# Patient Record
Sex: Male | Born: 1935 | Race: White | Hispanic: No | Marital: Married | Smoking: Former smoker
Health system: Southern US, Community
[De-identification: ages and names within clinical notes are randomized; demographics above are authoritative.]

## PROBLEM LIST (undated history)

## (undated) DIAGNOSIS — N400 Enlarged prostate without lower urinary tract symptoms: Secondary | ICD-10-CM

## (undated) DIAGNOSIS — F0390 Unspecified dementia without behavioral disturbance: Secondary | ICD-10-CM

## (undated) DIAGNOSIS — E785 Hyperlipidemia, unspecified: Secondary | ICD-10-CM

## (undated) DIAGNOSIS — I10 Essential (primary) hypertension: Secondary | ICD-10-CM

## (undated) DIAGNOSIS — M109 Gout, unspecified: Secondary | ICD-10-CM

---

## 2005-07-29 ENCOUNTER — Ambulatory Visit: Payer: Self-pay | Admitting: Internal Medicine

## 2006-06-30 ENCOUNTER — Other Ambulatory Visit: Payer: Self-pay

## 2006-07-01 ENCOUNTER — Inpatient Hospital Stay: Payer: Self-pay | Admitting: Internal Medicine

## 2006-07-08 ENCOUNTER — Ambulatory Visit: Payer: Self-pay | Admitting: Internal Medicine

## 2007-10-04 ENCOUNTER — Ambulatory Visit: Payer: Self-pay | Admitting: Family Medicine

## 2009-01-11 ENCOUNTER — Ambulatory Visit: Payer: Self-pay | Admitting: Internal Medicine

## 2010-03-15 ENCOUNTER — Ambulatory Visit: Payer: Self-pay | Admitting: Internal Medicine

## 2015-10-03 DIAGNOSIS — F0280 Dementia in other diseases classified elsewhere without behavioral disturbance: Secondary | ICD-10-CM | POA: Diagnosis not present

## 2015-10-03 DIAGNOSIS — M109 Gout, unspecified: Secondary | ICD-10-CM | POA: Diagnosis not present

## 2015-10-03 DIAGNOSIS — Z79899 Other long term (current) drug therapy: Secondary | ICD-10-CM | POA: Diagnosis not present

## 2015-10-03 DIAGNOSIS — Z87891 Personal history of nicotine dependence: Secondary | ICD-10-CM | POA: Diagnosis not present

## 2015-10-03 DIAGNOSIS — R195 Other fecal abnormalities: Secondary | ICD-10-CM | POA: Diagnosis not present

## 2015-10-03 DIAGNOSIS — K623 Rectal prolapse: Secondary | ICD-10-CM | POA: Diagnosis not present

## 2015-10-03 DIAGNOSIS — Z7982 Long term (current) use of aspirin: Secondary | ICD-10-CM | POA: Diagnosis not present

## 2015-10-03 DIAGNOSIS — I10 Essential (primary) hypertension: Secondary | ICD-10-CM | POA: Diagnosis not present

## 2015-10-03 DIAGNOSIS — K5641 Fecal impaction: Secondary | ICD-10-CM | POA: Diagnosis not present

## 2015-10-03 DIAGNOSIS — G309 Alzheimer's disease, unspecified: Secondary | ICD-10-CM | POA: Diagnosis not present

## 2015-10-03 DIAGNOSIS — R911 Solitary pulmonary nodule: Secondary | ICD-10-CM | POA: Diagnosis not present

## 2015-10-03 DIAGNOSIS — D72829 Elevated white blood cell count, unspecified: Secondary | ICD-10-CM | POA: Diagnosis not present

## 2015-10-03 DIAGNOSIS — K6289 Other specified diseases of anus and rectum: Secondary | ICD-10-CM | POA: Diagnosis not present

## 2015-10-03 DIAGNOSIS — I491 Atrial premature depolarization: Secondary | ICD-10-CM | POA: Diagnosis not present

## 2015-10-03 DIAGNOSIS — I447 Left bundle-branch block, unspecified: Secondary | ICD-10-CM | POA: Diagnosis not present

## 2015-10-03 DIAGNOSIS — I7090 Unspecified atherosclerosis: Secondary | ICD-10-CM | POA: Diagnosis not present

## 2015-10-04 DIAGNOSIS — K4020 Bilateral inguinal hernia, without obstruction or gangrene, not specified as recurrent: Secondary | ICD-10-CM | POA: Diagnosis not present

## 2015-10-04 DIAGNOSIS — K5641 Fecal impaction: Secondary | ICD-10-CM | POA: Diagnosis not present

## 2015-10-04 DIAGNOSIS — I10 Essential (primary) hypertension: Secondary | ICD-10-CM | POA: Diagnosis not present

## 2015-10-04 DIAGNOSIS — I493 Ventricular premature depolarization: Secondary | ICD-10-CM | POA: Diagnosis not present

## 2015-10-04 DIAGNOSIS — M1A9XX0 Chronic gout, unspecified, without tophus (tophi): Secondary | ICD-10-CM | POA: Diagnosis not present

## 2015-10-04 DIAGNOSIS — F0280 Dementia in other diseases classified elsewhere without behavioral disturbance: Secondary | ICD-10-CM | POA: Diagnosis not present

## 2015-10-04 DIAGNOSIS — N400 Enlarged prostate without lower urinary tract symptoms: Secondary | ICD-10-CM | POA: Diagnosis not present

## 2015-10-04 DIAGNOSIS — K5790 Diverticulosis of intestine, part unspecified, without perforation or abscess without bleeding: Secondary | ICD-10-CM | POA: Diagnosis not present

## 2015-10-04 DIAGNOSIS — G309 Alzheimer's disease, unspecified: Secondary | ICD-10-CM | POA: Diagnosis not present

## 2015-10-04 DIAGNOSIS — R001 Bradycardia, unspecified: Secondary | ICD-10-CM | POA: Diagnosis not present

## 2015-10-04 DIAGNOSIS — I2510 Atherosclerotic heart disease of native coronary artery without angina pectoris: Secondary | ICD-10-CM | POA: Diagnosis not present

## 2015-10-05 DIAGNOSIS — F0280 Dementia in other diseases classified elsewhere without behavioral disturbance: Secondary | ICD-10-CM | POA: Diagnosis not present

## 2015-10-05 DIAGNOSIS — I2510 Atherosclerotic heart disease of native coronary artery without angina pectoris: Secondary | ICD-10-CM | POA: Diagnosis not present

## 2015-10-05 DIAGNOSIS — G309 Alzheimer's disease, unspecified: Secondary | ICD-10-CM | POA: Diagnosis not present

## 2015-10-05 DIAGNOSIS — M1A9XX0 Chronic gout, unspecified, without tophus (tophi): Secondary | ICD-10-CM | POA: Diagnosis not present

## 2015-10-05 DIAGNOSIS — I10 Essential (primary) hypertension: Secondary | ICD-10-CM | POA: Diagnosis not present

## 2015-10-05 DIAGNOSIS — K5900 Constipation, unspecified: Secondary | ICD-10-CM | POA: Diagnosis not present

## 2015-10-05 DIAGNOSIS — K5641 Fecal impaction: Secondary | ICD-10-CM | POA: Diagnosis not present

## 2015-10-10 DIAGNOSIS — K5901 Slow transit constipation: Secondary | ICD-10-CM | POA: Diagnosis not present

## 2015-10-10 DIAGNOSIS — K5641 Fecal impaction: Secondary | ICD-10-CM | POA: Diagnosis not present

## 2015-10-30 DIAGNOSIS — G301 Alzheimer's disease with late onset: Secondary | ICD-10-CM | POA: Diagnosis not present

## 2015-10-30 DIAGNOSIS — F0280 Dementia in other diseases classified elsewhere without behavioral disturbance: Secondary | ICD-10-CM | POA: Diagnosis not present

## 2015-10-30 DIAGNOSIS — E782 Mixed hyperlipidemia: Secondary | ICD-10-CM | POA: Diagnosis not present

## 2015-10-30 DIAGNOSIS — I10 Essential (primary) hypertension: Secondary | ICD-10-CM | POA: Diagnosis not present

## 2015-10-30 DIAGNOSIS — Z23 Encounter for immunization: Secondary | ICD-10-CM | POA: Diagnosis not present

## 2015-10-30 DIAGNOSIS — M1A09X0 Idiopathic chronic gout, multiple sites, without tophus (tophi): Secondary | ICD-10-CM | POA: Diagnosis not present

## 2016-05-03 DIAGNOSIS — G301 Alzheimer's disease with late onset: Secondary | ICD-10-CM | POA: Diagnosis not present

## 2016-05-03 DIAGNOSIS — E782 Mixed hyperlipidemia: Secondary | ICD-10-CM | POA: Diagnosis not present

## 2016-05-03 DIAGNOSIS — M1A09X0 Idiopathic chronic gout, multiple sites, without tophus (tophi): Secondary | ICD-10-CM | POA: Diagnosis not present

## 2016-05-03 DIAGNOSIS — I10 Essential (primary) hypertension: Secondary | ICD-10-CM | POA: Diagnosis not present

## 2016-05-03 DIAGNOSIS — F0280 Dementia in other diseases classified elsewhere without behavioral disturbance: Secondary | ICD-10-CM | POA: Diagnosis not present

## 2016-05-03 DIAGNOSIS — F320 Major depressive disorder, single episode, mild: Secondary | ICD-10-CM | POA: Diagnosis not present

## 2016-05-31 DIAGNOSIS — F320 Major depressive disorder, single episode, mild: Secondary | ICD-10-CM | POA: Diagnosis not present

## 2016-09-01 DIAGNOSIS — R911 Solitary pulmonary nodule: Secondary | ICD-10-CM | POA: Diagnosis not present

## 2016-09-01 DIAGNOSIS — F320 Major depressive disorder, single episode, mild: Secondary | ICD-10-CM | POA: Diagnosis not present

## 2016-09-02 ENCOUNTER — Other Ambulatory Visit: Payer: Self-pay | Admitting: Family Medicine

## 2016-09-02 DIAGNOSIS — R911 Solitary pulmonary nodule: Secondary | ICD-10-CM

## 2016-09-09 ENCOUNTER — Ambulatory Visit: Payer: PPO

## 2016-11-01 DIAGNOSIS — G301 Alzheimer's disease with late onset: Secondary | ICD-10-CM | POA: Diagnosis not present

## 2016-11-01 DIAGNOSIS — F320 Major depressive disorder, single episode, mild: Secondary | ICD-10-CM | POA: Diagnosis not present

## 2016-11-01 DIAGNOSIS — M1A09X0 Idiopathic chronic gout, multiple sites, without tophus (tophi): Secondary | ICD-10-CM | POA: Diagnosis not present

## 2016-11-01 DIAGNOSIS — F0280 Dementia in other diseases classified elsewhere without behavioral disturbance: Secondary | ICD-10-CM | POA: Diagnosis not present

## 2016-11-01 DIAGNOSIS — I10 Essential (primary) hypertension: Secondary | ICD-10-CM | POA: Diagnosis not present

## 2016-11-01 DIAGNOSIS — E782 Mixed hyperlipidemia: Secondary | ICD-10-CM | POA: Diagnosis not present

## 2016-12-16 ENCOUNTER — Emergency Department: Payer: PPO

## 2016-12-16 ENCOUNTER — Inpatient Hospital Stay: Payer: PPO | Admitting: Certified Registered Nurse Anesthetist

## 2016-12-16 ENCOUNTER — Encounter
Admission: EM | Disposition: A | Discharge status: Skilled Nursing Facility | Payer: Self-pay | Source: Home / Self Care | Attending: Internal Medicine

## 2016-12-16 ENCOUNTER — Encounter: Payer: Self-pay | Admitting: Emergency Medicine

## 2016-12-16 ENCOUNTER — Inpatient Hospital Stay: Payer: PPO

## 2016-12-16 ENCOUNTER — Inpatient Hospital Stay
Admission: EM | Admit: 2016-12-16 | Discharge: 2016-12-20 | DRG: 470 | Disposition: A | Discharge status: Skilled Nursing Facility | Payer: PPO | Attending: Internal Medicine | Admitting: Internal Medicine

## 2016-12-16 DIAGNOSIS — S72001D Fracture of unspecified part of neck of right femur, subsequent encounter for closed fracture with routine healing: Secondary | ICD-10-CM | POA: Diagnosis not present

## 2016-12-16 DIAGNOSIS — R1312 Dysphagia, oropharyngeal phase: Secondary | ICD-10-CM | POA: Diagnosis not present

## 2016-12-16 DIAGNOSIS — Z8249 Family history of ischemic heart disease and other diseases of the circulatory system: Secondary | ICD-10-CM

## 2016-12-16 DIAGNOSIS — Z79899 Other long term (current) drug therapy: Secondary | ICD-10-CM

## 2016-12-16 DIAGNOSIS — S72091A Other fracture of head and neck of right femur, initial encounter for closed fracture: Secondary | ICD-10-CM | POA: Diagnosis not present

## 2016-12-16 DIAGNOSIS — I10 Essential (primary) hypertension: Secondary | ICD-10-CM | POA: Diagnosis not present

## 2016-12-16 DIAGNOSIS — F0281 Dementia in other diseases classified elsewhere with behavioral disturbance: Secondary | ICD-10-CM | POA: Diagnosis not present

## 2016-12-16 DIAGNOSIS — F0280 Dementia in other diseases classified elsewhere without behavioral disturbance: Secondary | ICD-10-CM | POA: Diagnosis not present

## 2016-12-16 DIAGNOSIS — F329 Major depressive disorder, single episode, unspecified: Secondary | ICD-10-CM | POA: Diagnosis not present

## 2016-12-16 DIAGNOSIS — M1000 Idiopathic gout, unspecified site: Secondary | ICD-10-CM | POA: Diagnosis not present

## 2016-12-16 DIAGNOSIS — J9811 Atelectasis: Secondary | ICD-10-CM | POA: Diagnosis not present

## 2016-12-16 DIAGNOSIS — R4182 Altered mental status, unspecified: Secondary | ICD-10-CM

## 2016-12-16 DIAGNOSIS — T402X5A Adverse effect of other opioids, initial encounter: Secondary | ICD-10-CM | POA: Diagnosis not present

## 2016-12-16 DIAGNOSIS — S72041A Displaced fracture of base of neck of right femur, initial encounter for closed fracture: Secondary | ICD-10-CM | POA: Diagnosis not present

## 2016-12-16 DIAGNOSIS — Z6824 Body mass index (BMI) 24.0-24.9, adult: Secondary | ICD-10-CM | POA: Diagnosis not present

## 2016-12-16 DIAGNOSIS — R400 Somnolence: Secondary | ICD-10-CM | POA: Diagnosis not present

## 2016-12-16 DIAGNOSIS — Z87891 Personal history of nicotine dependence: Secondary | ICD-10-CM | POA: Diagnosis not present

## 2016-12-16 DIAGNOSIS — R0902 Hypoxemia: Secondary | ICD-10-CM | POA: Diagnosis not present

## 2016-12-16 DIAGNOSIS — Z5189 Encounter for other specified aftercare: Secondary | ICD-10-CM | POA: Diagnosis not present

## 2016-12-16 DIAGNOSIS — J90 Pleural effusion, not elsewhere classified: Secondary | ICD-10-CM | POA: Diagnosis not present

## 2016-12-16 DIAGNOSIS — S72001A Fracture of unspecified part of neck of right femur, initial encounter for closed fracture: Secondary | ICD-10-CM | POA: Diagnosis not present

## 2016-12-16 DIAGNOSIS — S72009A Fracture of unspecified part of neck of unspecified femur, initial encounter for closed fracture: Secondary | ICD-10-CM | POA: Diagnosis not present

## 2016-12-16 DIAGNOSIS — W19XXXA Unspecified fall, initial encounter: Secondary | ICD-10-CM | POA: Diagnosis present

## 2016-12-16 DIAGNOSIS — G309 Alzheimer's disease, unspecified: Secondary | ICD-10-CM | POA: Diagnosis not present

## 2016-12-16 DIAGNOSIS — Z96641 Presence of right artificial hip joint: Secondary | ICD-10-CM | POA: Diagnosis not present

## 2016-12-16 DIAGNOSIS — Z471 Aftercare following joint replacement surgery: Secondary | ICD-10-CM | POA: Diagnosis not present

## 2016-12-16 DIAGNOSIS — M6281 Muscle weakness (generalized): Secondary | ICD-10-CM | POA: Diagnosis not present

## 2016-12-16 DIAGNOSIS — Z96649 Presence of unspecified artificial hip joint: Secondary | ICD-10-CM

## 2016-12-16 DIAGNOSIS — Z7982 Long term (current) use of aspirin: Secondary | ICD-10-CM

## 2016-12-16 DIAGNOSIS — M109 Gout, unspecified: Secondary | ICD-10-CM | POA: Diagnosis present

## 2016-12-16 HISTORY — PX: HIP ARTHROPLASTY: SHX981

## 2016-12-16 HISTORY — DX: Essential (primary) hypertension: I10

## 2016-12-16 HISTORY — DX: Gout, unspecified: M10.9

## 2016-12-16 LAB — BASIC METABOLIC PANEL
Anion gap: 7 (ref 5–15)
BUN: 22 mg/dL — AB (ref 6–20)
CHLORIDE: 108 mmol/L (ref 101–111)
CO2: 25 mmol/L (ref 22–32)
Calcium: 8.7 mg/dL — ABNORMAL LOW (ref 8.9–10.3)
Creatinine, Ser: 1.13 mg/dL (ref 0.61–1.24)
GFR calc Af Amer: >60 mL/min (ref >60)
GFR calc non Af Amer: 56 mL/min — ABNORMAL LOW (ref >60)
GLUCOSE: 114 mg/dL — AB (ref 65–99)
Potassium: 4.5 mmol/L (ref 3.5–5.1)
Sodium: 140 mmol/L (ref 135–145)

## 2016-12-16 LAB — CBC WITH DIFFERENTIAL/PLATELET
Basophils Absolute: 0 10*3/uL (ref 0–0.1)
Basophils Relative: 0 %
EOS PCT: 4 %
Eosinophils Absolute: 0.5 10*3/uL (ref 0–0.7)
HCT: 41.3 % (ref 40.0–52.0)
Hemoglobin: 13.9 g/dL (ref 13.0–18.0)
LYMPHS ABS: 1.3 10*3/uL (ref 1.0–3.6)
LYMPHS PCT: 11 %
MCH: 31.7 pg (ref 26.0–34.0)
MCHC: 33.6 g/dL (ref 32.0–36.0)
MCV: 94.5 fL (ref 80.0–100.0)
MONO ABS: 0.8 10*3/uL (ref 0.2–1.0)
Monocytes Relative: 7 %
Neutro Abs: 8.8 10*3/uL — ABNORMAL HIGH (ref 1.4–6.5)
Neutrophils Relative %: 78 %
Platelets: 180 10*3/uL (ref 150–440)
RBC: 4.37 MIL/uL — ABNORMAL LOW (ref 4.40–5.90)
RDW: 14 % (ref 11.5–14.5)
WBC: 11.4 10*3/uL — ABNORMAL HIGH (ref 3.8–10.6)

## 2016-12-16 LAB — PROTIME-INR
INR: 1.15
Prothrombin Time: 14.8 seconds (ref 11.4–15.2)

## 2016-12-16 SURGERY — HEMIARTHROPLASTY, HIP, DIRECT ANTERIOR APPROACH, FOR FRACTURE
Anesthesia: Spinal | Site: Hip | Laterality: Right | Wound class: Clean

## 2016-12-16 MED ORDER — TRANEXAMIC ACID 1000 MG/10ML IV SOLN
INTRAVENOUS | Status: DC | PRN
  Administered 2016-12-16: 1000 mg

## 2016-12-16 MED ORDER — ONDANSETRON HCL 4 MG PO TABS
4.0000 mg | ORAL_TABLET | Freq: Four times a day (QID) | ORAL | Status: DC | PRN
Start: 2016-12-16 — End: 2016-12-20

## 2016-12-16 MED ORDER — GLYCOPYRROLATE 0.2 MG/ML IJ SOLN
INTRAMUSCULAR | Status: DC | PRN
  Administered 2016-12-16: 0.2 mg via INTRAVENOUS

## 2016-12-16 MED ORDER — ALLOPURINOL 100 MG PO TABS
100.0000 mg | ORAL_TABLET | Freq: Every day | ORAL | Status: DC
  Administered 2016-12-17 – 2016-12-20 (×3): 100 mg via ORAL
  Filled 2016-12-16 (×3): qty 1

## 2016-12-16 MED ORDER — GLYCOPYRROLATE 0.2 MG/ML IJ SOLN
INTRAMUSCULAR | Status: AC
  Filled 2016-12-16: qty 1

## 2016-12-16 MED ORDER — FLEET ENEMA 7-19 GM/118ML RE ENEM
1.0000 | ENEMA | Freq: Once | RECTAL | Status: AC | PRN
  Administered 2016-12-20: 1 via RECTAL

## 2016-12-16 MED ORDER — CEFAZOLIN SODIUM-DEXTROSE 2-3 GM-% IV SOLR
INTRAVENOUS | Status: DC | PRN
  Administered 2016-12-16: 2 g via INTRAVENOUS

## 2016-12-16 MED ORDER — HYDROMORPHONE HCL 1 MG/ML IJ SOLN
0.5000 mg | INTRAMUSCULAR | Status: DC | PRN
  Administered 2016-12-16 – 2016-12-17 (×4): 0.5 mg via INTRAVENOUS
  Administered 2016-12-17: 1 mg via INTRAVENOUS
  Administered 2016-12-18 (×2): 0.5 mg via INTRAVENOUS
  Filled 2016-12-16 (×9): qty 1

## 2016-12-16 MED ORDER — FAMOTIDINE 20 MG PO TABS
20.0000 mg | ORAL_TABLET | Freq: Every day | ORAL | Status: DC
  Administered 2016-12-16 – 2016-12-20 (×3): 20 mg via ORAL
  Filled 2016-12-16 (×4): qty 1

## 2016-12-16 MED ORDER — ONDANSETRON HCL 4 MG/2ML IJ SOLN: 4.0000 mg | Freq: Four times a day (QID) | INTRAMUSCULAR | Status: DC | PRN

## 2016-12-16 MED ORDER — ATENOLOL 12.5 MG HALF TABLET
12.5000 mg | ORAL_TABLET | Freq: Every day | ORAL | Status: DC
  Administered 2016-12-17 – 2016-12-20 (×3): 12.5 mg via ORAL
  Filled 2016-12-16 (×4): qty 1

## 2016-12-16 MED ORDER — BISACODYL 10 MG RE SUPP
10.0000 mg | Freq: Every day | RECTAL | Status: DC | PRN
  Administered 2016-12-18 – 2016-12-20 (×2): 10 mg via RECTAL
  Filled 2016-12-16 (×2): qty 1

## 2016-12-16 MED ORDER — NEOMYCIN-POLYMYXIN B GU 40-200000 IR SOLN
Status: DC | PRN
  Administered 2016-12-16: 16 mL

## 2016-12-16 MED ORDER — DOCUSATE SODIUM 100 MG PO CAPS: 100.0000 mg | ORAL_CAPSULE | Freq: Two times a day (BID) | ORAL | Status: DC

## 2016-12-16 MED ORDER — ACETAMINOPHEN 500 MG PO TABS
1000.0000 mg | ORAL_TABLET | Freq: Four times a day (QID) | ORAL | Status: AC
  Administered 2016-12-16 – 2016-12-17 (×4): 1000 mg via ORAL
  Filled 2016-12-16 (×4): qty 2

## 2016-12-16 MED ORDER — PROPOFOL 500 MG/50ML IV EMUL
INTRAVENOUS | Status: DC | PRN
  Administered 2016-12-16: 25 ug/kg/min via INTRAVENOUS

## 2016-12-16 MED ORDER — BUPIVACAINE HCL (PF) 0.5 % IJ SOLN
INTRAMUSCULAR | Status: AC
  Filled 2016-12-16: qty 10

## 2016-12-16 MED ORDER — METOCLOPRAMIDE HCL 5 MG/ML IJ SOLN: 5.0000 mg | Freq: Three times a day (TID) | INTRAMUSCULAR | Status: DC | PRN

## 2016-12-16 MED ORDER — KETOROLAC TROMETHAMINE 15 MG/ML IJ SOLN: 15.0000 mg | Freq: Four times a day (QID) | INTRAMUSCULAR | Status: DC | PRN

## 2016-12-16 MED ORDER — SENNOSIDES-DOCUSATE SODIUM 8.6-50 MG PO TABS: 1.0000 | ORAL_TABLET | Freq: Every evening | ORAL | Status: DC | PRN

## 2016-12-16 MED ORDER — PANTOPRAZOLE SODIUM 40 MG PO TBEC
40.0000 mg | DELAYED_RELEASE_TABLET | Freq: Every day | ORAL | Status: DC
  Administered 2016-12-16 – 2016-12-20 (×4): 40 mg via ORAL
  Filled 2016-12-16 (×4): qty 1

## 2016-12-16 MED ORDER — DIPHENHYDRAMINE HCL 12.5 MG/5ML PO ELIX: 12.5000 mg | ORAL_SOLUTION | ORAL | Status: DC | PRN

## 2016-12-16 MED ORDER — BUPIVACAINE HCL (PF) 0.5 % IJ SOLN
INTRAMUSCULAR | Status: DC | PRN
  Administered 2016-12-16: 3 mL via INTRATHECAL

## 2016-12-16 MED ORDER — OXYCODONE HCL 5 MG/5ML PO SOLN: 5.0000 mg | Freq: Once | ORAL | Status: DC | PRN

## 2016-12-16 MED ORDER — EPHEDRINE SULFATE 50 MG/ML IJ SOLN
INTRAMUSCULAR | Status: DC | PRN
  Administered 2016-12-16: 10 mg via INTRAVENOUS

## 2016-12-16 MED ORDER — BUPIVACAINE LIPOSOME 1.3 % IJ SUSP
INTRAMUSCULAR | Status: DC | PRN
  Administered 2016-12-16: 20 mL

## 2016-12-16 MED ORDER — SODIUM CHLORIDE 0.9% FLUSH
3.0000 mL | Freq: Two times a day (BID) | INTRAVENOUS | Status: DC
  Administered 2016-12-17 – 2016-12-20 (×5): 3 mL via INTRAVENOUS

## 2016-12-16 MED ORDER — LACTATED RINGERS IV SOLN
INTRAVENOUS | Status: DC | PRN
  Administered 2016-12-16 (×2): via INTRAVENOUS

## 2016-12-16 MED ORDER — PROPOFOL 10 MG/ML IV BOLUS
INTRAVENOUS | Status: AC
  Filled 2016-12-16: qty 20

## 2016-12-16 MED ORDER — CEFAZOLIN SODIUM-DEXTROSE 2-4 GM/100ML-% IV SOLN
2.0000 g | Freq: Four times a day (QID) | INTRAVENOUS | Status: AC
  Administered 2016-12-16 – 2016-12-17 (×3): 2 g via INTRAVENOUS
  Filled 2016-12-16 (×4): qty 100

## 2016-12-16 MED ORDER — OXYCODONE HCL 5 MG PO TABS: 5.0000 mg | ORAL_TABLET | Freq: Once | ORAL | Status: DC | PRN

## 2016-12-16 MED ORDER — SEVOFLURANE IN SOLN
RESPIRATORY_TRACT | Status: AC
  Filled 2016-12-16: qty 250

## 2016-12-16 MED ORDER — FENTANYL CITRATE (PF) 100 MCG/2ML IJ SOLN: 25.0000 ug | INTRAMUSCULAR | Status: DC | PRN

## 2016-12-16 MED ORDER — METOPROLOL TARTRATE 5 MG/5ML IV SOLN: 5.0000 mg | INTRAVENOUS | Status: DC | PRN

## 2016-12-16 MED ORDER — PHENYLEPHRINE HCL 10 MG/ML IJ SOLN
INTRAMUSCULAR | Status: DC | PRN
  Administered 2016-12-16 (×3): 100 ug via INTRAVENOUS

## 2016-12-16 MED ORDER — ACETAMINOPHEN 325 MG PO TABS: 650.0000 mg | ORAL_TABLET | Freq: Four times a day (QID) | ORAL | Status: DC | PRN

## 2016-12-16 MED ORDER — ACETAMINOPHEN 650 MG RE SUPP: 650.0000 mg | Freq: Four times a day (QID) | RECTAL | Status: DC | PRN

## 2016-12-16 MED ORDER — CEFAZOLIN SODIUM-DEXTROSE 2-4 GM/100ML-% IV SOLN
2.0000 g | Freq: Once | INTRAVENOUS | Status: DC
  Filled 2016-12-16: qty 100

## 2016-12-16 MED ORDER — KCL IN DEXTROSE-NACL 20-5-0.9 MEQ/L-%-% IV SOLN
INTRAVENOUS | Status: DC
  Administered 2016-12-16: 22:00:00 via INTRAVENOUS
  Filled 2016-12-16 (×9): qty 1000

## 2016-12-16 MED ORDER — KETAMINE HCL 10 MG/ML IJ SOLN
INTRAMUSCULAR | Status: DC | PRN
  Administered 2016-12-16: 25 mg via INTRAVENOUS

## 2016-12-16 MED ORDER — ENOXAPARIN SODIUM 40 MG/0.4ML ~~LOC~~ SOLN
40.0000 mg | SUBCUTANEOUS | Status: DC
  Administered 2016-12-17 – 2016-12-20 (×3): 40 mg via SUBCUTANEOUS
  Filled 2016-12-16 (×4): qty 0.4

## 2016-12-16 MED ORDER — ACETAMINOPHEN 325 MG PO TABS
650.0000 mg | ORAL_TABLET | Freq: Four times a day (QID) | ORAL | Status: DC | PRN
  Administered 2016-12-20: 650 mg via ORAL
  Filled 2016-12-16: qty 2

## 2016-12-16 MED ORDER — PROPOFOL 10 MG/ML IV BOLUS
INTRAVENOUS | Status: DC | PRN
  Administered 2016-12-16: 20 mg via INTRAVENOUS

## 2016-12-16 MED ORDER — MIDAZOLAM HCL 2 MG/2ML IJ SOLN
INTRAMUSCULAR | Status: AC
  Filled 2016-12-16: qty 2

## 2016-12-16 MED ORDER — METOCLOPRAMIDE HCL 10 MG PO TABS: 5.0000 mg | ORAL_TABLET | Freq: Three times a day (TID) | ORAL | Status: DC | PRN

## 2016-12-16 MED ORDER — KETAMINE HCL 50 MG/ML IJ SOLN
INTRAMUSCULAR | Status: AC
  Filled 2016-12-16: qty 10

## 2016-12-16 MED ORDER — TRAMADOL HCL 50 MG PO TABS
50.0000 mg | ORAL_TABLET | Freq: Four times a day (QID) | ORAL | Status: DC | PRN
  Administered 2016-12-18: 50 mg via ORAL
  Filled 2016-12-16: qty 1

## 2016-12-16 MED ORDER — LIDOCAINE HCL (PF) 2 % IJ SOLN
INTRAMUSCULAR | Status: DC | PRN
  Administered 2016-12-16: 50 mg

## 2016-12-16 MED ORDER — LIDOCAINE HCL (PF) 2 % IJ SOLN
INTRAMUSCULAR | Status: AC
  Filled 2016-12-16: qty 2

## 2016-12-16 MED ORDER — MIDAZOLAM HCL 5 MG/5ML IJ SOLN
INTRAMUSCULAR | Status: DC | PRN
  Administered 2016-12-16 (×2): 1 mg via INTRAVENOUS

## 2016-12-16 MED ORDER — MAGNESIUM HYDROXIDE 400 MG/5ML PO SUSP: 30.0000 mL | Freq: Every day | ORAL | Status: DC | PRN

## 2016-12-16 MED ORDER — DOCUSATE SODIUM 100 MG PO CAPS
100.0000 mg | ORAL_CAPSULE | Freq: Two times a day (BID) | ORAL | Status: DC
  Administered 2016-12-16 – 2016-12-20 (×5): 100 mg via ORAL
  Filled 2016-12-16 (×7): qty 1

## 2016-12-16 MED ORDER — BUPIVACAINE-EPINEPHRINE (PF) 0.25% -1:200000 IJ SOLN
INTRAMUSCULAR | Status: DC | PRN
  Administered 2016-12-16: 30 mL

## 2016-12-16 MED ORDER — OXYCODONE HCL 5 MG PO TABS
5.0000 mg | ORAL_TABLET | ORAL | Status: DC | PRN
  Administered 2016-12-18: 5 mg via ORAL
  Filled 2016-12-16: qty 1
  Filled 2016-12-16: qty 2

## 2016-12-16 MED ORDER — ONDANSETRON HCL 4 MG PO TABS: 4.0000 mg | ORAL_TABLET | Freq: Four times a day (QID) | ORAL | Status: DC | PRN

## 2016-12-16 MED ORDER — SODIUM CHLORIDE 0.9 % IV SOLN
INTRAVENOUS | Status: DC | PRN
  Administered 2016-12-16: 20 ug/min via INTRAVENOUS

## 2016-12-16 MED ORDER — OMEGA-3-ACID ETHYL ESTERS 1 G PO CAPS
1.0000 | ORAL_CAPSULE | Freq: Every day | ORAL | Status: DC
  Administered 2016-12-17 – 2016-12-20 (×2): 1 g via ORAL
  Filled 2016-12-16 (×2): qty 1

## 2016-12-16 SURGICAL SUPPLY — 56 items
BAG DECANTER FOR FLEXI CONT (MISCELLANEOUS) IMPLANT
BLADE SAGITTAL WIDE XTHICK NO (BLADE) ×3 IMPLANT
BLADE SURG SZ20 CARB STEEL (BLADE) ×3 IMPLANT
BNDG COHESIVE 6X5 TAN STRL LF (GAUZE/BANDAGES/DRESSINGS) ×3 IMPLANT
BOWL CEMENT MIXING ADV NOZZLE (MISCELLANEOUS) IMPLANT
CANISTER SUCT 1200ML W/VALVE (MISCELLANEOUS) ×3 IMPLANT
CANISTER SUCT 3000ML PPV (MISCELLANEOUS) ×6 IMPLANT
CAPT HIP HEMI 2 ×3 IMPLANT
CHLORAPREP W/TINT 26ML (MISCELLANEOUS) ×6 IMPLANT
DECANTER SPIKE VIAL GLASS SM (MISCELLANEOUS) ×6 IMPLANT
DRAPE IMP U-DRAPE 54X76 (DRAPES) ×6 IMPLANT
DRAPE INCISE IOBAN 66X60 STRL (DRAPES) ×3 IMPLANT
DRAPE SHEET LG 3/4 BI-LAMINATE (DRAPES) ×3 IMPLANT
DRAPE SURG 17X11 SM STRL (DRAPES) ×3 IMPLANT
DRAPE SURG 17X23 STRL (DRAPES) ×3 IMPLANT
DRSG OPSITE POSTOP 4X12 (GAUZE/BANDAGES/DRESSINGS) ×3 IMPLANT
DRSG OPSITE POSTOP 4X14 (GAUZE/BANDAGES/DRESSINGS) ×3 IMPLANT
ELECT BLADE 6.5 EXT (BLADE) ×3 IMPLANT
ELECT CAUTERY BLADE 6.4 (BLADE) ×3 IMPLANT
ELECT REM PT RETURN 9FT ADLT (ELECTROSURGICAL) ×3
ELECTRODE REM PT RTRN 9FT ADLT (ELECTROSURGICAL) ×1 IMPLANT
GAUZE PACK 2X3YD (MISCELLANEOUS) IMPLANT
GLOVE BIO SURGEON STRL SZ8 (GLOVE) ×6 IMPLANT
GLOVE INDICATOR 8.0 STRL GRN (GLOVE) ×3 IMPLANT
GOWN STRL REUS W/ TWL LRG LVL3 (GOWN DISPOSABLE) ×1 IMPLANT
GOWN STRL REUS W/ TWL XL LVL3 (GOWN DISPOSABLE) ×1 IMPLANT
GOWN STRL REUS W/TWL LRG LVL3 (GOWN DISPOSABLE) ×2
GOWN STRL REUS W/TWL XL LVL3 (GOWN DISPOSABLE) ×2
HOOD PEEL AWAY FLYTE STAYCOOL (MISCELLANEOUS) ×6 IMPLANT
IV NS 100ML SINGLE PACK (IV SOLUTION) IMPLANT
LABEL OR SOLS (LABEL) ×3 IMPLANT
NDL SAFETY 18GX1.5 (NEEDLE) ×3 IMPLANT
NEEDLE FILTER BLUNT 18X 1/2SAF (NEEDLE) ×2
NEEDLE FILTER BLUNT 18X1 1/2 (NEEDLE) ×1 IMPLANT
NEEDLE SPNL 20GX3.5 QUINCKE YW (NEEDLE) ×3 IMPLANT
NS IRRIG 1000ML POUR BTL (IV SOLUTION) ×3 IMPLANT
PACK HIP PROSTHESIS (MISCELLANEOUS) ×3 IMPLANT
PILLOW ABDUC SM (MISCELLANEOUS) ×3 IMPLANT
PULSAVAC PLUS IRRIG FAN TIP (DISPOSABLE) ×3
SOL .9 NS 3000ML IRR  AL (IV SOLUTION) ×4
SOL .9 NS 3000ML IRR UROMATIC (IV SOLUTION) ×2 IMPLANT
STAPLER SKIN PROX 35W (STAPLE) ×3 IMPLANT
STRAP SAFETY BODY (MISCELLANEOUS) ×3 IMPLANT
SUT ETHIBOND 2 V 37 (SUTURE) ×9 IMPLANT
SUT VIC AB 1 CT1 36 (SUTURE) ×6 IMPLANT
SUT VIC AB 2-0 CT1 (SUTURE) ×9 IMPLANT
SUT VIC AB 2-0 CT1 27 (SUTURE) ×6
SUT VIC AB 2-0 CT1 TAPERPNT 27 (SUTURE) ×3 IMPLANT
SUT VICRYL 1-0 27IN ABS (SUTURE) ×6
SUTURE VICRYL 1-0 27IN ABS (SUTURE) ×2 IMPLANT
SYR 30ML LL (SYRINGE) ×9 IMPLANT
SYR TB 1ML 27GX1/2 LL (SYRINGE) IMPLANT
SYRINGE 10CC LL (SYRINGE) ×3 IMPLANT
TAPE TRANSPORE STRL 2 31045 (GAUZE/BANDAGES/DRESSINGS) ×3 IMPLANT
TIP BRUSH PULSAVAC PLUS 24.33 (MISCELLANEOUS) ×3 IMPLANT
TIP FAN IRRIG PULSAVAC PLUS (DISPOSABLE) ×1 IMPLANT

## 2016-12-16 NOTE — Consult Note (Signed)
ORTHOPAEDIC CONSULTATION  REQUESTING PHYSICIAN: Ramonita LabGouru, Aruna, MD  Chief Complaint:   Right hip pain.  History of Present Illness: Curtis Nunez is a 83 y.o. male with a history of gout, hypertension, and dementia who lives at home with his wife. The patient is supposed to ambulate with a cane or walker, but often chooses not to use them. Apparently he got up during the night and fell, landing on his right side. The fall was not witnessed. The patient was found this morning and brought to the emergency room where x-rays demonstrated a displaced right femoral neck fracture. The patient denies any associated injuries, and denies any loss of consciousness. The patient could not say whether he had any chest pain, shortness of breath, dizziness, chest pain, or other symptoms which may have precipitated his fall.  Past Medical History:  Diagnosis Date  . Gout   . Hypertension    History reviewed. No pertinent surgical history. Social History   Social History  . Marital status: Married    Spouse name: N/A  . Number of children: N/A  . Years of education: N/A   Social History Main Topics  . Smoking status: Former Smoker    Types: Cigarettes  . Smokeless tobacco: Former NeurosurgeonUser  . Alcohol use No  . Drug use: No  . Sexual activity: Not Asked   Other Topics Concern  . None   Social History Narrative  . None   History reviewed. No pertinent family history. No Known Allergies Prior to Admission medications   Medication Sig Start Date End Date Taking? Authorizing Provider  allopurinol (ZYLOPRIM) 100 MG tablet Take by mouth. 11/16/16  Yes [provider]  aspirin EC 81 MG tablet Take 81 mg by mouth daily.    Yes [provider]  atenolol (TENORMIN) 25 MG tablet Take 12.5 mg by mouth daily. 06/01/16  Yes [provider]  COCONUT OIL PO Take 1 tablet by mouth daily.   Yes [provider]   Omega-3 Fatty Acids (FISH OIL OMEGA-3 PO) Take 1 tablet by mouth daily.   Yes [provider]   Dg Chest 1 View  Result Date: 12/16/2016 CLINICAL DATA:  Proper evaluation, hip fracture, history hypertension, former smoker EXAM: CHEST 1 VIEW COMPARISON:  Portable exam at 1132 hours correlated with prior CT chest 07/01/2006 FINDINGS: Enlargement of cardiac silhouette. Atherosclerotic calcification aorta. Mediastinal contours and pulmonary vascularity normal. Bibasilar atelectasis. Lungs otherwise clear. No pleural effusion or pneumothorax. Bones demineralized. IMPRESSION: Enlargement cardiac silhouette. Bibasilar atelectasis. Aortic atherosclerosis. Electronically Signed   By: Ulyses SouthwardMark  Boles M.D.   On: 12/16/2016 11:47   Dg Hip Unilat W Or Wo Pelvis 2-3 Views Right  Result Date: 12/16/2016 CLINICAL DATA:  Fall, right hip pain EXAM: DG HIP (WITH OR WITHOUT PELVIS) 2-3V RIGHT COMPARISON:  None. FINDINGS: There is a right femoral neck fracture with varus angulation. No subluxation or dislocation. IMPRESSION: Right femoral neck fracture with varus angulation. Electronically Signed   By: Charlett NoseKevin  Dover M.D.   On: 12/16/2016 11:08    Positive ROS: All other systems have been reviewed and were otherwise negative with the exception of those mentioned in the HPI and as above.  Physical Exam: General:  Alert, no acute distress Psychiatric:  Patient is competent for consent with normal mood and affect   Cardiovascular:  No pedal edema Respiratory:  No wheezing, non-labored breathing GI:  Abdomen is soft and non-tender Skin:  No lesions in the area of chief complaint Neurologic:  Sensation intact distally Lymphatic:  No axillary or cervical lymphadenopathy  Orthopedic Exam:  Orthopedic examination is limited to the right hip and lower extremity. The hip is held in a flexed and internally rotated position. Skin inspection around the right hip is unremarkable. There is no swelling, erythema, ecchymosis,  or abrasions. He has mild tenderness to palpation over the lateral aspect of the right hip. He has more significant pain with any attempted active or passive motion of the hip. He is able to dorsiflex and plantarflex his toes and ankle on the right side. Sensation is intact to light touch to all distributions. Good capillary refill to his right foot.  X-rays:  X-rays of the pelvis and right hip are available for review. These films demonstrate a displaced right femoral neck fracture. No significant degenerative changes are noted. No lytic lesions or other acute bony abnormalities are identified.  Assessment: Displaced right femoral neck fracture.  Plan: The treatment options are discussed with the patient and his family, who are at the bedside. Both nonsurgical and surgical options were discussed. The patient and his family would like to proceed with surgical intervention to include a right hip hemiarthroplasty. This procedure has been discussed in detail with the patient, as have the potential risks (including bleeding, infection, nerve and/or blood vessel injury, persistent or recurrent pain, stiffness of the hip, dislocation, leg length inequality, need for further surgery, blood clots, strokes, heart attacks and/or arrhythmias, etc.) and benefits. The patient's family state their understanding and agreed to proceed. A formal written consent has been obtained.  Thank you for ask me to precipitate in the care of this most pleasant unfortunate man. I will be happy to follow him with you.   Maryagnes Amos, MD  Beeper #:  [phone]  12/16/2016 4:44 PM

## 2016-12-16 NOTE — H&P (Addendum)
[facility] - [place] at [facility]   PATIENT NAME: Curtis Nunez    MR#:  [id number]  DATE OF BIRTH:  1927-02-26  DATE OF ADMISSION:  12/16/2016  PRIMARY CARE PHYSICIAN: System, Pcp Not In   REQUESTING/REFERRING PHYSICIAN: STAFFORD  CHIEF COMPLAINT:  Fall and hip pain  HISTORY OF PRESENT ILLNESS:  Curtis Nunez  is a 83 y.o. male with a known history ofAlzheimer's dementia Hypertension, gout and depression is brought into the ED after he sustained a fall at home. Patient has chronic dementia and could not give any meaningful history. X-ray of the hip has revealed a right femoral neck fracture. Ortho consulted and hospitalist team is called to admit the patient. Patient's EKG has revealed a bundle branch block, which seems to be old when compared to a year 2010 EKG  PAST MEDICAL HISTORY:   Past Medical History:  Diagnosis Date  . Gout   . Hypertension   Alzheimer's dementia  PAST SURGICAL HISTOIRY:  History reviewed. No pertinent surgical history.  SOCIAL HISTORY:   Social History  Substance Use Topics  . Smoking status: Former Smoker    Types: Cigarettes  . Smokeless tobacco: Former Neurosurgeon  . Alcohol use No    FAMILY HISTORY:  Hypertension runs in his family according to the wife  DRUG ALLERGIES:  No Known Allergies  REVIEW OF SYSTEMS:  Review of system unobtainable as the patient is demented  MEDICATIONS AT HOME:   Prior to Admission medications   Medication Sig Start Date End Date Taking? Authorizing Provider  allopurinol (ZYLOPRIM) 100 MG tablet Take by mouth. 11/16/16  Yes [provider]  aspirin EC 81 MG tablet Take 81 mg by mouth daily.    Yes [provider]  atenolol (TENORMIN) 25 MG tablet Take 12.5 mg by mouth daily. 06/01/16  Yes [provider]  COCONUT OIL PO Take 1 tablet by mouth daily.   Yes [provider]  Omega-3 Fatty Acids (FISH OIL OMEGA-3 PO) Take 1 tablet by mouth daily.   Yes  [provider]      VITAL SIGNS:  Blood pressure (!) 169/77, pulse (!) 56, temperature 98.3 F (36.8 C), temperature source Oral, resp. rate 17, height 5\' 7"  (1.702 m), weight 70.3 kg (155 lb), SpO2 94 %.  PHYSICAL EXAMINATION:  GENERAL:  83 y.o.-year-old patient lying in the bed with no acute distress.  EYES: Pupils equal, round, reactive to light and accommodation. No scleral icterus.HEENT: Head atraumatic, normocephalic. Oropharynx and nasopharynx clear.  NECK:  Supple, no jugular venous distention. No thyroid enlargement, no tenderness.  LUNGS: Normal breath sounds bilaterally, no wheezing, rales,rhonchi or crepitation. No use of accessory muscles of respiration.  CARDIOVASCULAR: S1, S2 normal. No murmurs, rubs, or gallops.  ABDOMEN: Soft, nontender, nondistended. Bowel sounds present.  EXTREMITIES: Right hip is abducted and externally rotated No pedal edema, cyanosis, or clubbing.  NEUROLOGIC: Patient is chronically demented, and alert  PSYCHIATRIC: The patient is alert and disoriented from dementia SKIN: No obvious rash, lesion, or ulcer.   LABORATORY PANEL:   CBC No results for input(s): WBC, HGB, HCT, PLT in the last 168 hours. ------------------------------------------------------------------------------------------------------------------  Chemistries  No results for input(s): NA, K, CL, CO2, GLUCOSE, BUN, CREATININE, CALCIUM, MG, AST, ALT, ALKPHOS, BILITOT in the last 168 hours.  Invalid input(s): GFRCGP ------------------------------------------------------------------------------------------------------------------  Cardiac Enzymes No results for input(s): TROPONINI in the last 168 hours. ------------------------------------------------------------------------------------------------------------------  RADIOLOGY:  Dg Chest 1 View  Result Date: 12/16/2016 CLINICAL DATA:  Proper evaluation, hip fracture, history hypertension, former smoker EXAM: CHEST 1 VIEW  COMPARISON:  Portable exam at 1132 hours correlated with prior CT chest 07/01/2006 FINDINGS: Enlargement of cardiac silhouette. Atherosclerotic calcification aorta. Mediastinal contours and pulmonary vascularity normal. Bibasilar atelectasis. Lungs otherwise clear. No pleural effusion or pneumothorax. Bones demineralized. IMPRESSION: Enlargement cardiac silhouette. Bibasilar atelectasis. Aortic atherosclerosis. Electronically Signed   By: Ulyses SouthwardMark  Boles M.D.   On: 12/16/2016 11:47   Dg Hip Unilat W Or Wo Pelvis 2-3 Views Right  Result Date: 12/16/2016 CLINICAL DATA:  Fall, right hip pain EXAM: DG HIP (WITH OR WITHOUT PELVIS) 2-3V RIGHT COMPARISON:  None. FINDINGS: There is a right femoral neck fracture with varus angulation. No subluxation or dislocation. IMPRESSION: Right femoral neck fracture with varus angulation. Electronically Signed   By: Charlett NoseKevin  Dover M.D.   On: 12/16/2016 11:08    EKG:   Orders placed or performed in visit on 01/11/09  . EKG 12-Lead    IMPRESSION AND PLAN:  Curtis Nunez  is a 83 y.o. male with a known history ofAlzheimer's dementia Hypertension, gout and depression is brought into the ED after he sustained a fall at home. Patient has chronic dementia and could not give any meaningful history. X-ray of the hip has revealed a right femoral neck fracture  #Right femoral neck fracture Admit to MedSurg unit Nothing by mouth except for sips and meds Pain management as needed Supportive treatment Orthopedics consulted  patient medically optimized for noncardiac surgery   #Hypertension Blood pressure is elevated, could be from the pain Provided Lopressor IV as needed Continue home medication atenolol  #Chronic history of gout No exacerbation at this time continue allopurinol, his home medication  #Chronic history of Alzheimer's dementia Patient is not on any home medications. Monitor clinically  GI prophylaxis with Pepcid DVT prophylaxis with SCDs  All the records  are reviewed and case discussed with ED provider. Management plans discussed with the patient, family and they are in agreement.  CODE STATUS: fc , wife HCPOA  TOTAL TIME TAKING CARE OF THIS PATIENT:42 minutes.   Note: This dictation was prepared with Dragon dictation along with smaller phrase technology. Any transcriptional errors that result from this process are unintentional.  Ramonita LabGouru, Labella Zahradnik M.D on 12/16/2016 at 1:33 PM  Between 7am to 6pm - Pager - [phone]  After 6pm go to [email or web address] - password [email or web address]  Fabio [facility] [place]  Office  [phone]  CC: Primary care physician; System, Pcp Not In

## 2016-12-16 NOTE — Op Note (Signed)
12/16/2016  6:36 PM  Patient:   Curtis Nunez  Pre-Op Diagnosis:   Displaced femoral neck fracture, right hip.  Post-Op Diagnosis:   Same.  Procedure:   Right hip unipolar hemiarthroplasty.  Surgeon:   Maryagnes Amos, MD  Assistant:   Horris Latino, PA-C  Anesthesia:   Spinal  Findings:   As above.  Complications:   None  EBL:   200 cc  Fluids:   1100 cc crystalloid  UOP:   200 cc  TT:   None  Drains:   None  Closure:   Staples  Implants:   Biomet press-fit system with a #10 standard offset reduced profile Echo femoral stem, a 49 mm outer diameter shell, a 28 mm head, and a -3 mm neck  Brief Clinical Note:   The patient is an 83 year old male who sustained the above-noted injury last evening when he fell at his home. He is brought to the emergency room this morning where x-rays demonstrated the above-noted fracture. The patient has been cleared medically and presents at this time for definitive management of the injury.  Procedure:   The patient was brought into the operating room. After adequate spinal anesthesia was obtained, the patient was repositioned in the left lateral decubitus position and secured using a lateral hip positioner. The right hip and lower extremity were prepped with ChloroPrep solution before being draped sterilely. Preoperative antibiotics were administered. A timeout was performed to verify the appropriate surgical site before a standard posterior approach to the hip was made through an approximately 4-5 inch incision. The incision was carried down through the subcutaneous tissues to expose the gluteal fascia and proximal end of the iliotibial band. These structures were split the length of the incision and the Charnley self-retaining hip retractor placed. The bursal tissues were swept posteriorly to expose the short external rotators. The anterior border of the piriformis tendon was identified and this plane developed down through the capsule to enter  the joint. Abundant fracture hematoma was suctioned. A flap of tissue was elevated off the posterior aspect of the femoral neck and greater trochanter and retracted posteriorly. This flap included the piriformis tendon, the short external rotators, and the posterior capsule. The femoral head was removed in its entirety, then taken to the back table where it was measured and found to be optimally replicated by a 49 mm head. The appropriate trial head was inserted and found to demonstrate an excellent suction fit.   Attention was directed to the femoral side. The femoral neck was recut 10-12 mm above the lesser trochanter using an oscillating saw. The piriformis fossa was debrided of soft tissues before the intramedullary canal was accessed through this point using a triple step reamer. The canal was reamed sequentially beginning with a #7 tapered reamer and progressing to a #10 tapered reamer. This provided excellent circumferential chatter. A box osteotome was used to establish version before the canal was broached sequentially beginning with a #7 broach and progressing to a #10 broach. This was left in place and several trial reductions performed. The permanent #10 reduce profile standard offset Biomet Echo femoral stem was impacted into place. A repeat trial reduction was performed using both the -6 mm and -3 mm neck lengths. The -3 mm neck length demonstrated excellent stability both in extension and external rotation as well as with flexion to 90 and internal rotation beyond 70. It also was stable in the position of sleep. The 49 mm outer diameter shell with  the -3 mm neck adapter construct was put together on the back table before being impacted onto the stem of the femoral component. The Morse taper locking mechanism was verified using manual distraction before the head was relocated and placed through a range of motion with the findings as described above.  The wound was copiously irrigated with  bacitracin saline solution via the jet lavage system before the peri-incisional and pericapsular tissues were injected with 30 cc of 0.5% Sensorcaine with epinephrine and 20 cc of Exparel diluted out to 60 cc with normal saline to help with postoperative analgesia. The posterior flap was reapproximated to the posterior aspect of the greater trochanter using #2 Tycron interrupted sutures placed through drill holes. Several additional #2 Tycron interrupted sutures were used to reinforce this layer of closure. The iliotibial band was reapproximated using #1 Vicryl interrupted sutures before the gluteal fascia was closed using a running #1 Vicryl suture. At this point, 1 g of transexemic acid in 10 cc of normal saline was injected into the joint to help reduce postoperative bleeding. The subcutaneous tissues were closed in several layers using 2-0 Vicryl interrupted sutures before the skin was closed using staples. A sterile occlusive dressing was applied to the wound before the patient was placed into an abduction wedge pillow. The patient was then rolled back into the supine position on the hospital bed before being awakened and returned to the recovery room in satisfactory condition after tolerating the procedure well.

## 2016-12-16 NOTE — Progress Notes (Signed)
Family Meeting Note  Advance Directive:yes  Today a meeting took place with the Patients spouse  Patient is unable to participate due ZO:[id number]:Lacked capacity Alzheimer's dementia   The following clinical team members were present during this meeting:MD  The following were discussed:Patient's diagnosis: Management and plan of care discussed with the patient's spouse, she is agreeable she is the healthcare power of attorney Patient's progosis: Unable to determine and Goals for treatment: Full Code  Additional follow-up to be provided: Hospitalist and orthopedics  Time spent during discussion:5717min  Aveon Colquhoun, Deanna ArtisAruna, MD

## 2016-12-16 NOTE — ED Notes (Signed)
Pt transported to OR by OR tech

## 2016-12-16 NOTE — Anesthesia Procedure Notes (Signed)
Spinal  Patient location during procedure: OR Staffing Anesthesiologist: Katy Fitch K Resident/CRNA: Rolla Plate Performed: resident/CRNA  Preanesthetic Checklist Completed: patient identified, site marked, surgical consent, pre-op evaluation, timeout performed, IV checked, risks and benefits discussed and monitors and equipment checked Spinal Block Patient position: sitting Prep: ChloraPrep and site prepped and draped Patient monitoring: heart rate, continuous pulse ox, blood pressure and cardiac monitor Approach: midline Location: L4-5 Injection technique: single-shot Needle Needle type: Introducer and Pencan  Needle gauge: 24 G Needle length: 9 cm Additional Notes Negative paresthesia. Negative blood return. Positive free-flowing CSF. Expiration date of kit checked and confirmed. Patient tolerated procedure well, without complications.

## 2016-12-16 NOTE — ED Notes (Signed)
Pt's top plate dentures removed, placed in denture cup, and given to spouse. Pt only has top plate at this time.

## 2016-12-16 NOTE — ED Triage Notes (Signed)
Pt via ems from home after falling last night. Pt c/o pain in right hip upon palpation. Pt unable to localize pain verbally; hx of dementia. Pt and spouse deny head injury or loc; wife states that "he falls around a lot." She states that he had mechanical fall. NAD noted.

## 2016-12-16 NOTE — ED Notes (Signed)
Received call from OR stating pt will be brought up in 30-45 min and not to transfer to floor at this time. Pt's family updated.

## 2016-12-16 NOTE — ED Provider Notes (Addendum)
[facility] Emergency Department Provider Note  ____________________________________________  Time seen: Approximately 11:29 AM  I have reviewed the triage vital signs and the nursing notes.   HISTORY  Chief Complaint Fall and Hip Pain  Level 5 caveat:  Portions of the history and physical were unable to be obtained due to the patient's altered mental status from chronic Alzheimer's dementia   HPI Curtis Nunez is a 83 y.o. male brought to the ED via EMS due to a fall at home. Patient denies any complaints but EMS does report hip pain on moving the patient. Otherwise patient is at his normal baseline. Per EMS no vomiting or bleeding on scene. Patient has been calm with them.     Past Medical History:  Diagnosis Date  . Gout   . Hypertension      There are no active problems to display for this patient.    History reviewed. No pertinent surgical history.   Prior to Admission medications   Not on File   Prescription Sig. Disp. Refills Start Date End Date Status  allopurinol (ZYLOPRIM) 100 MG tablet  Take 100 mg by mouth.   09/15/2015  Active  aspirin (ECOTRIN) 81 MG tablet  Take 81 mg by mouth.     Active  atenolol (TENORMIN) 25 MG tablet  Take 25 mg by mouth.   12/13/2014  Active  coconut oil, bulk, Oil       Active  UNABLE TO FIND  Take 2 capsules by mouth. Fish oil     Active  polyethylene glycol (MIRALAX) 17 gram packet            Allergies Patient has no known allergies.   History reviewed. No pertinent family history.  Social History Social History  Substance Use Topics  . Smoking status: Former Smoker    Types: Cigarettes  . Smokeless tobacco: Former NeurosurgeonUser  . Alcohol use No    Review of Systems Unable to obtain due to altered mental status.  ____________________________________________   PHYSICAL EXAM:  VITAL SIGNS: ED Triage Vitals  Enc Vitals Group     BP 12/16/16 0949 (!) 188/90      Pulse Rate 12/16/16 0949 (!) 55     Resp 12/16/16 0949 17     Temp 12/16/16 0949 98.3 F (36.8 C)     Temp Source 12/16/16 0949 Oral     SpO2 12/16/16 0949 93 %     Weight 12/16/16 0950 155 lb (70.3 kg)     Height 12/16/16 0950 5\' 7"  (1.702 m)     Head Circumference --      [facility] Flow --      Pain Score --      Pain Loc --      Pain Edu? --      Excl. in GC? --     Vital signs reviewed, nursing assessments reviewed.   Constitutional:   Alert and orientedTo self. Well appearing and in no distress. Eyes:   No scleral icterus.  EOMI. No nystagmus. No conjunctival pallor. PERRL. ENT   Head:   Normocephalic and atraumatic. No contusions or abrasions.   Nose:   No congestion/rhinnorhea.    Mouth/Throat:   MMM, no pharyngeal erythema. No peritonsillar mass.    Neck:   No meningismus. Full ROM. No midline tenderness Hematological/Lymphatic/Immunilogical:   No cervical lymphadenopathy. Cardiovascular:   RRR. Symmetric bilateral radial and DP pulses.  No murmurs.  Respiratory:   Normal respiratory effort without tachypnea/retractions. Breath  sounds are clear and equal bilaterally. No wheezes/rales/rhonchi. Gastrointestinal:   Soft and nontender. Non distended. There is no CVA tenderness.  No rebound, rigidity, or guarding. Genitourinary:   deferred Musculoskeletal:   Pain on palpation of right hip at the area of the greater trochanter. Pain with flexion of the right hip. Left lower extremity unremarkable. Right knee and distal femur unremarkable.. Neurologic:   Normal speech and language. Patient not able to provide precise answers regarding recent events. Motor grossly intact. No gross focal neurologic deficits are appreciated.  Skin:    Skin is warm, dry and intact. No rash noted.  No petechiae, purpura, or bullae.  ____________________________________________    LABS (pertinent positives/negatives) (all labs ordered are listed, but only abnormal results are  displayed) Labs Reviewed  BASIC METABOLIC PANEL  CBC WITH DIFFERENTIAL/PLATELET  PROTIME-INR   ____________________________________________   EKG  Interpreted by me   ____________________________________________    RADIOLOGY  Dg Hip Unilat W Or Wo Pelvis 2-3 Views Right  Result Date: 12/16/2016 CLINICAL DATA:  Fall, right hip pain EXAM: DG HIP (WITH OR WITHOUT PELVIS) 2-3V RIGHT COMPARISON:  None. FINDINGS: There is a right femoral neck fracture with varus angulation. No subluxation or dislocation. IMPRESSION: Right femoral neck fracture with varus angulation. Electronically Signed   By: Charlett Nose M.D.   On: 12/16/2016 11:08    ____________________________________________   PROCEDURES Procedures  ____________________________________________   INITIAL IMPRESSION / ASSESSMENT AND PLAN / ED COURSE  Pertinent labs & imaging results that were available during my care of the patient were reviewed by me and considered in my medical decision making (see chart for details).  Patient presents with right hip pain after an unwitnessed fall. No evidence of trauma anywhere else other than the right hip. X-ray does reveal a right femoral neck fracture. No evidence of CNS injury. Low suspicion of intracranial hemorrhage or spinal injury. We'll discuss with orthopedics and plan for hospitalization.   ----------------------------------------- 1:08 PM on 12/16/2016 -----------------------------------------  Discussed with orthopedics, request patient remain nothing by mouth. We'll insert Foley. Case discussed with hospitalist for admission.     ____________________________________________   FINAL CLINICAL IMPRESSION(S) / ED DIAGNOSES  Final diagnoses:  Closed displaced fracture of right femoral neck (HCC)  Alzheimer's dementia without behavioral disturbance, unspecified timing of dementia onset      New Prescriptions   No medications on file     Portions of this note  were generated with dragon dictation software. Dictation errors may occur despite best attempts at proofreading.    Sharman Cheek, MD 12/16/16 1136    Sharman Cheek, MD 12/16/16 1308

## 2016-12-16 NOTE — Anesthesia Post-op Follow-up Note (Cosign Needed)
Anesthesia QCDR form completed.        

## 2016-12-16 NOTE — Anesthesia Preprocedure Evaluation (Addendum)
Anesthesia Evaluation  Patient identified by MRN, date of birth, ID band Patient awake and Patient confused    Reviewed: Allergy & Precautions, H&P , NPO status , Patient's Chart, lab work & pertinent test results  History of Anesthesia Complications (+) PROLONGED EMERGENCE and history of anesthetic complications  Airway Mallampati: III  TM Distance: >3 FB Neck ROM: limited    Dental  (+) Poor Dentition, Missing, Edentulous Upper, Edentulous Lower   Pulmonary neg shortness of breath, former smoker,    Pulmonary exam normal breath sounds clear to auscultation       Cardiovascular Exercise Tolerance: Good hypertension, (-) angina(-) Past MI Normal cardiovascular exam Rhythm:regular Rate:Normal     Neuro/Psych negative neurological ROS  negative psych ROS   GI/Hepatic negative GI ROS, Neg liver ROS,   Endo/Other  negative endocrine ROS  Renal/GU      Musculoskeletal   Abdominal   Peds  Hematology negative hematology ROS (+)   Anesthesia Other Findings Past Medical History: No date: Gout No date: Hypertension  History reviewed. No pertinent surgical history.  BMI    Body Mass Index:  24.28 kg/m      Reproductive/Obstetrics negative OB ROS                            Anesthesia Physical Anesthesia Plan  ASA: III  Anesthesia Plan: Spinal   Post-op Pain Management:    Induction:   PONV Risk Score and Plan: 1 and Ondansetron and Treatment may vary due to age  Airway Management Planned: Natural Airway and Nasal Cannula  Additional Equipment:   Intra-op Plan:   Post-operative Plan:   Informed Consent: I have reviewed the patients History and Physical, chart, labs and discussed the procedure including the risks, benefits and alternatives for the proposed anesthesia with the patient or authorized representative who has indicated his/her understanding and acceptance.   Dental  Advisory Given  Plan Discussed with: Anesthesiologist, CRNA and Surgeon  Anesthesia Plan Comments: (Wife consented  Patient and family informed that patient is higher risk for complications from anesthesia during this procedure due to their medical history and age including but not limited to post operative cognitive dysfunction.  They voiced understanding.  Wife reports no bleeding problems and no anticoagulant use.  Plan for spinal with backup GA  Patient consented for risks of anesthesia including but not limited to:  - adverse reactions to medications - risk of bleeding, infection, nerve damage and headache - risk of failed spinal - damage to teeth, lips or other oral mucosa - sore throat or hoarseness - Damage to heart, brain, lungs or loss of life  Patient voiced understanding.)        Anesthesia Quick Evaluation

## 2016-12-16 NOTE — Transfer of Care (Signed)
Immediate Anesthesia Transfer of Care Note  Patient: Curtis Nunez  Procedure(s) Performed: Procedure(s): ARTHROPLASTY BIPOLAR HIP (HEMIARTHROPLASTY) (Right)  Patient Location: PACU  Anesthesia Type:Spinal  Level of Consciousness: sedated  Airway & Oxygen Therapy: Patient Spontanous Breathing and Patient connected to face mask oxygen  Post-op Assessment: Report given to RN and Post -op Vital signs reviewed and stable  Post vital signs: Reviewed and stable  Last Vitals:  Vitals:   12/16/16 1600 12/16/16 1601  BP: (!) 154/87   Pulse: 62 60  Resp: 17 18  Temp:      Last Pain:  Vitals:   12/16/16 0949  TempSrc: Oral         Complications: No apparent anesthesia complications

## 2016-12-17 ENCOUNTER — Encounter: Payer: Self-pay | Admitting: Surgery

## 2016-12-17 LAB — CBC WITH DIFFERENTIAL/PLATELET
Basophils Absolute: 0 10*3/uL (ref 0–0.1)
Basophils Relative: 0 %
EOS ABS: 0.5 10*3/uL (ref 0–0.7)
EOS PCT: 4 %
HCT: 33 % — ABNORMAL LOW (ref 40.0–52.0)
HEMOGLOBIN: 11.3 g/dL — AB (ref 13.0–18.0)
LYMPHS ABS: 1.5 10*3/uL (ref 1.0–3.6)
LYMPHS PCT: 13 %
MCH: 32.3 pg (ref 26.0–34.0)
MCHC: 34.3 g/dL (ref 32.0–36.0)
MCV: 94.3 fL (ref 80.0–100.0)
MONOS PCT: 7 %
Monocytes Absolute: 0.8 10*3/uL (ref 0.2–1.0)
NEUTROS PCT: 76 %
Neutro Abs: 8.8 10*3/uL — ABNORMAL HIGH (ref 1.4–6.5)
Platelets: 155 10*3/uL (ref 150–440)
RBC: 3.5 MIL/uL — ABNORMAL LOW (ref 4.40–5.90)
RDW: 14.4 % (ref 11.5–14.5)
WBC: 11.6 10*3/uL — ABNORMAL HIGH (ref 3.8–10.6)

## 2016-12-17 LAB — COMPREHENSIVE METABOLIC PANEL
ALT: 12 U/L — AB (ref 17–63)
AST: 23 U/L (ref 15–41)
Albumin: 2.7 g/dL — ABNORMAL LOW (ref 3.5–5.0)
Alkaline Phosphatase: 66 U/L (ref 38–126)
Anion gap: 7 (ref 5–15)
BUN: 22 mg/dL — AB (ref 6–20)
CHLORIDE: 111 mmol/L (ref 101–111)
CO2: 24 mmol/L (ref 22–32)
CREATININE: 1.26 mg/dL — AB (ref 0.61–1.24)
Calcium: 8 mg/dL — ABNORMAL LOW (ref 8.9–10.3)
GFR calc Af Amer: 57 mL/min — ABNORMAL LOW (ref >60)
GFR calc non Af Amer: 49 mL/min — ABNORMAL LOW (ref >60)
Glucose, Bld: 122 mg/dL — ABNORMAL HIGH (ref 65–99)
Potassium: 4.3 mmol/L (ref 3.5–5.1)
SODIUM: 142 mmol/L (ref 135–145)
Total Bilirubin: 0.7 mg/dL (ref 0.3–1.2)
Total Protein: 5.8 g/dL — ABNORMAL LOW (ref 6.5–8.1)

## 2016-12-17 MED ORDER — ASPIRIN EC 81 MG PO TBEC
81.0000 mg | DELAYED_RELEASE_TABLET | Freq: Every day | ORAL | Status: DC
  Administered 2016-12-17 – 2016-12-20 (×3): 81 mg via ORAL
  Filled 2016-12-17 (×3): qty 1

## 2016-12-17 NOTE — Anesthesia Postprocedure Evaluation (Signed)
Anesthesia Post Note  Patient: Brain HiltsRalph T Mauger  Procedure(s) Performed: Procedure(s) (LRB): ARTHROPLASTY BIPOLAR HIP (HEMIARTHROPLASTY) (Right)  Patient location during evaluation: Nursing Unit Anesthesia Type: Spinal Level of consciousness: awake and patient cooperative Pain management: pain level controlled Vital Signs Assessment: post-procedure vital signs reviewed and stable Respiratory status: spontaneous breathing, nonlabored ventilation and respiratory function stable Cardiovascular status: blood pressure returned to baseline and stable Postop Assessment: no headache and no backache     Last Vitals:  Vitals:   12/17/16 0045 12/17/16 0346  BP: (!) 145/77 135/71  Pulse: (!) 54 (!) 56  Resp:  18  Temp:  36.8 C    Last Pain:  Vitals:   12/17/16 0346  TempSrc: Axillary                 Ginger CarneStephanie Shimeka Bacot

## 2016-12-17 NOTE — NC FL2 (Signed)
[place] MEDICAID FL2 LEVEL OF CARE SCREENING TOOL     IDENTIFICATION  Patient Name: Curtis Nunez Birthdate: 1926-10-31 Sex: male Admission Date (Current Location): 12/16/2016  [place] Number:  Chiropodist and Address:  [facility], [address], [place], [place] [address]      Provider Number: [id number]  Attending Physician Name and Address:  Auburn Bilberry, MD  Relative Name and Phone Number:       Current Level of Care: Hospital Recommended Level of Care: Skilled Nursing Facility Prior Approval Number:    Date Approved/Denied:   PASRR Number:  [id number])  Discharge Plan: SNF    Current Diagnoses: Patient Active Problem List   Diagnosis Date Noted  . Hip fracture (HCC) 12/16/2016    Orientation RESPIRATION BLADDER Height & Weight     Self, Place  Normal Continent Weight: 155 lb (70.3 kg) Height:  5\' 7"  (170.2 cm)  BEHAVIORAL SYMPTOMS/MOOD NEUROLOGICAL BOWEL NUTRITION STATUS   (none)  (none) Continent Diet (Diet: Clear Liquid to be advanced. )  AMBULATORY STATUS COMMUNICATION OF NEEDS Skin   Extensive Assist Verbally Surgical wounds (Incision: Right Hip. )                       Personal Care Assistance Level of Assistance  Bathing, Feeding, Dressing Bathing Assistance: Limited assistance Feeding assistance: Independent Dressing Assistance: Limited assistance     Functional Limitations Info  Sight, Hearing, Speech Sight Info: Adequate Hearing Info: Adequate Speech Info: Adequate    SPECIAL CARE FACTORS FREQUENCY  PT (By licensed PT), OT (By licensed OT)     PT Frequency:  (5) OT Frequency:  (5)            Contractures      Additional Factors Info  Code Status, Allergies Code Status Info:  (Full Code. ) Allergies Info:  (No Known Allergies. )           Current Medications (12/17/2016):  This is the current hospital active medication list Current Facility-Administered Medications   Medication Dose Route Frequency Provider Last Rate Last Dose  . acetaminophen (TYLENOL) tablet 650 mg  650 mg Oral Q6H PRN Gouru, Aruna, MD       Or  . acetaminophen (TYLENOL) suppository 650 mg  650 mg Rectal Q6H PRN Gouru, Aruna, MD      . acetaminophen (TYLENOL) tablet 1,000 mg  1,000 mg Oral Q6H Poggi, Excell Seltzer, MD   1,000 mg at 12/17/16 0534  . allopurinol (ZYLOPRIM) tablet 100 mg  100 mg Oral Daily Gouru, Aruna, MD   100 mg at 12/17/16 0825  . atenolol (TENORMIN) tablet 12.5 mg  12.5 mg Oral Daily Gouru, Aruna, MD      . bisacodyl (DULCOLAX) suppository 10 mg  10 mg Rectal Daily PRN Poggi, Excell Seltzer, MD      . ceFAZolin (ANCEF) IVPB 2g/100 mL premix  2 g Intravenous Once Poggi, Excell Seltzer, MD      . ceFAZolin (ANCEF) IVPB 2g/100 mL premix  2 g Intravenous Q6H Poggi, Excell Seltzer, MD   Stopped at 12/17/16 5621  . dextrose 5 % and 0.9 % NaCl with KCl 20 mEq/L infusion   Intravenous Continuous Ramonita Lab, MD 75 mL/hr at 12/16/16 2138    . diphenhydrAMINE (BENADRYL) 12.5 MG/5ML elixir 12.5-25 mg  12.5-25 mg Oral Q4H PRN Poggi, Excell Seltzer, MD      . docusate sodium (COLACE) capsule 100 mg  100 mg  Oral BID Ramonita LabGouru, Aruna, MD   100 mg at 12/16/16 2058  . enoxaparin (LOVENOX) injection 40 mg  40 mg Subcutaneous Q24H Poggi, Excell SeltzerJohn J, MD   40 mg at 12/17/16 0824  . famotidine (PEPCID) tablet 20 mg  20 mg Oral Daily Gouru, Aruna, MD   20 mg at 12/17/16 0825  . HYDROmorphone (DILAUDID) injection 0.5-1 mg  0.5-1 mg Intravenous Q2H PRN Poggi, Excell SeltzerJohn J, MD   0.5 mg at 12/17/16 0316  . ketorolac (TORADOL) 15 MG/ML injection 15 mg  15 mg Intravenous Q6H PRN Gouru, Aruna, MD      . magnesium hydroxide (MILK OF MAGNESIA) suspension 30 mL  30 mL Oral Daily PRN Poggi, Excell SeltzerJohn J, MD      . metoCLOPramide (REGLAN) tablet 5-10 mg  5-10 mg Oral Q8H PRN Poggi, Excell SeltzerJohn J, MD       Or  . metoCLOPramide (REGLAN) injection 5-10 mg  5-10 mg Intravenous Q8H PRN Poggi, Excell SeltzerJohn J, MD      . metoprolol tartrate (LOPRESSOR) injection 5 mg  5 mg  Intravenous Q4H PRN Gouru, Aruna, MD      . omega-3 acid ethyl esters (LOVAZA) capsule 1 g  1 capsule Oral Daily Gouru, Aruna, MD      . ondansetron (ZOFRAN) tablet 4 mg  4 mg Oral Q6H PRN Gouru, Aruna, MD       Or  . ondansetron (ZOFRAN) injection 4 mg  4 mg Intravenous Q6H PRN Gouru, Aruna, MD      . oxyCODONE (Oxy IR/ROXICODONE) immediate release tablet 5-10 mg  5-10 mg Oral Q3H PRN Poggi, Excell SeltzerJohn J, MD      . pantoprazole (PROTONIX) EC tablet 40 mg  40 mg Oral Daily Poggi, Excell SeltzerJohn J, MD   40 mg at 12/17/16 0825  . senna-docusate (Senokot-S) tablet 1 tablet  1 tablet Oral QHS PRN Gouru, Aruna, MD      . sodium chloride flush (NS) 0.9 % injection 3 mL  3 mL Intravenous Q12H Gouru, Aruna, MD      . sodium phosphate (FLEET) 7-19 GM/118ML enema 1 enema  1 enema Rectal Once PRN Poggi, Excell SeltzerJohn J, MD      . traMADol Janean Sark(ULTRAM) tablet 50 mg  50 mg Oral Q6H PRN Ramonita LabGouru, Aruna, MD         Discharge Medications: Please see discharge summary for a list of discharge medications.  Relevant Imaging Results:  Relevant Lab Results:   Additional Information  (SSN: [id number])  Prudie Guthridge, Darleen CrockerBailey M, LCSW

## 2016-12-17 NOTE — Clinical Social Work Placement (Signed)
   CLINICAL SOCIAL WORK PLACEMENT  NOTE  Date:  12/17/2016  Patient Details  Name: Trixie RudeRalph T Balaban MRN: [id number] Date of Birth: 08/24/1926  Clinical Social Work is seeking post-discharge placement for this patient at the Skilled  Nursing Facility level of care (*CSW will initial, date and re-position this form in  chart as items are completed):  Yes   Patient/family provided with [facility] [place] Clinical Social Work Department's list of facilities offering this level of care within the geographic area requested by the patient (or if unable, by the patient's family).  Yes   Patient/family informed of their freedom to choose among providers that offer the needed level of care, that participate in Medicare, Medicaid or managed care program needed by the patient, have an available bed and are willing to accept the patient.  Yes   Patient/family informed of [facility] interest [facility], as well as of the fact that they are under no obligation to receive care at these facilities.  PASRR submitted to EDS on 12/17/16     PASRR number received on 12/17/16     Existing PASRR number confirmed on       FL2 transmitted to all facilities in geographic area requested by pt/family on 12/17/16     FL2 transmitted to all facilities within larger geographic area on       Patient informed that his/her managed care company has contracts with or will negotiate with certain facilities, including the following:        Yes   Patient/family informed of bed offers received.  Patient chooses bed at  ([facility] )     Physician recommends and patient chooses bed at      Patient to be transferred to   on  .  Patient to be transferred to facility by       Patient family notified on   of transfer.  Name of family member notified:        PHYSICIAN       Additional Comment:    _______________________________________________ Dabria Wadas, Darleen CrockerBailey M, LCSW 12/17/2016, 3:56  PM

## 2016-12-17 NOTE — Clinical Social Work Note (Signed)
Clinical Social Work Assessment  Patient Details  Name: Curtis Nunez MRN: [id number] Date of Birth: 20-Dec-1926  Date of referral:  12/17/16               Reason for consult:  Facility Placement                Permission sought to share information with:  Chartered certified accountant granted to share information::  Yes, Verbal Permission Granted  Name::      [place]::   [place]   Relationship::     Contact Information:     Housing/Transportation Living arrangements for the past 2 months:  Single Family Home Source of Information:  Spouse Patient Interpreter Needed:  None Criminal Activity/Legal Involvement Pertinent to Current Situation/Hospitalization:  No - Comment as needed Significant Relationships:  Adult Children, Other Family Members, Spouse Lives with:  Spouse Do you feel safe going back to the place where you live?  Yes Need for family participation in patient care:  Yes (Comment)  Care giving concerns:  Patient lives in [place] with his wife Curtis Nunez.    Social Worker assessment / plan:  Holiday representative (CSW) received SNF consult. PT is recommending SNF. CSW met with patient and his wife Curtis Nunez and granddaughter were at bedside. Patient was pleasantly confused and did not participate in assessment. CSW introduced self and explained role of CSW department. Patient's wife reported that they live in [place]. CSW explained SNF process and that Health Team will have to approve SNF. Wife is agreeable to SNF search in [place]. FL2 complete and faxed out.   CSW presented bed offers to patient's wife Curtis Nunez and she chose [facility]. Health Team authorization has been received for SNF, auth # [id number]. Plan is for patient to D/C to [facility] tomorrow pending medical clearance. [facility] liaison is aware of above. CSW will continue to follow and assist as needed.   Employment status:  Retired Office manager PT Recommendations:  [place] / Referral to community resources:  [place]  Patient/Family's Response to care:  Patient's wife is agreeable for patient to go to [facility].   Patient/Family's Understanding of and Emotional Response to Diagnosis, Current Treatment, and Prognosis:  Patient's wife was very pleasant and thanked CSW for assistance.   Emotional Assessment Appearance:  Appears stated age Attitude/Demeanor/Rapport:    Affect (typically observed):  Pleasant Orientation:  Oriented to Self, Fluctuating Orientation (Suspected and/or reported Sundowners) Alcohol / Substance use:  Not Applicable Psych involvement (Current and /or in the community):  No (Comment)  Discharge Needs  Concerns to be addressed:  Discharge Planning Concerns Readmission within the last 30 days:  No Current discharge risk:  Dependent with Mobility Barriers to Discharge:  Continued Medical Work up   [employer], Veronia Beets, LCSW 12/17/2016, 3:57 PM

## 2016-12-17 NOTE — Progress Notes (Signed)
Pt. C/o hip pain.Pt. Spit all his meds out. Meds crushed in applesauce including PO pain med. Pt. Spit out all pills.out. Oxycodone wasted and dilaudid 1mg  given IV. Pt. Resting quietly now.

## 2016-12-17 NOTE — Progress Notes (Signed)
Physical Therapy Treatment Patient Details Name: Curtis Nunez MRN: [id number] DOB: 1926-08-08 Today's Date: 12/17/2016    History of Present Illness Pt is an 83 y.o. male with a known history of Alzheimer's dementia Hypertension, gout and depression is brought into the ED after he sustained a fall at home. Patient has chronic dementia and could not give any meaningful history.  X-ray of the hip has revealed a right femoral neck fracture. Ortho consulted and hospitalist team is called to admit the patient.  Pt now s/p R hip hemiarthroplasty.     PT Comments    Pt presents with deficits in strength, transfers, mobility, gait, balance, and activity tolerance.  Pt required Mod A with sup to sit down from Max A during AM session.  Pt continued to require Max A with sit to sup.  Pt required Mod A with sit to/from stand from elevated EOB and as during the AM session was not able to stand fully upright.  Pt also required assistance to prevent posterior LOB and was unable to lift either LE from the floor.  Max standing tolerance increased from 5 to 10 sec but pt continues to sit impulsively back to EOB and is a high risk for falls.  Pt will benefit from PT services in a SNF setting upon discharge to address above deficits for decreased caregiver assistance and decreased fall risk.     Follow Up Recommendations  SNF     Equipment Recommendations  None recommended by PT    Recommendations for Other Services       Precautions / Restrictions Precautions Precautions: Fall;Posterior Hip Precaution Booklet Issued: Yes (comment) Precaution Comments: Posterior hip precaution education provided to pt and family Restrictions Weight Bearing Restrictions: Yes RLE Weight Bearing: Weight bearing as tolerated Other Position/Activity Restrictions: Abd pillow re-donned at end of session    Mobility  Bed Mobility Overal bed mobility: Needs Assistance Bed Mobility: Supine to Sit;Sit to Supine     Supine  to sit: Mod assist Sit to supine: Max assist      Transfers Overall transfer level: Needs assistance Equipment used: Rolling walker (2 wheeled) Transfers: Sit to/from Stand Sit to Stand: Mod assist         General transfer comment: Sit to/from stand from elevated EOB to maintain hip precautions; max standing time 5-10 sec with pt unable to stand fully upright  Ambulation/Gait             General Gait Details: Pt unable to pick either foot up off the ground   Stairs Stairs:  (Unable/unsafe to attempt)          Wheelchair Mobility    Modified Rankin (Stroke Patients Only)       Balance Overall balance assessment: Needs assistance Sitting-balance support: Feet supported;Bilateral upper extremity supported Sitting balance-Leahy Scale: Fair Sitting balance - Comments: Improved static sitting balance at EOB from poor in the AM to fair this PM session   Standing balance support: Bilateral upper extremity supported Standing balance-Leahy Scale: Poor Standing balance comment: Pt required min-mod A to prevent posterior LOB in standing                            Cognition Arousal/Alertness: Awake/alert Behavior During Therapy: WFL for tasks assessed/performed Overall Cognitive Status: History of cognitive impairments - at baseline  Exercises Total Joint Exercises Ankle Circles/Pumps: AAROM;Both;10 reps;15 reps Heel Slides: AAROM;Both;5 reps;10 reps Hip ABduction/ADduction: AAROM;Both;10 reps;15 reps Straight Leg Raises: AAROM;Both;10 reps;15 reps Long Arc Quad: AAROM;Both;10 reps Knee Flexion: AAROM;Both;10 reps    General Comments        Pertinent Vitals/Pain Pain Assessment:  (Pt unable to rate secondary to cognition)    Home Living Family/patient expects to be discharged to:: Other (Comment) (History provided by family secondary to pt's cognitive deficits ) Living Arrangements:  Spouse/significant other Available Help at Discharge: Family;Available 24 hours/day Type of Home: House Home Access: Stairs to enter Entrance Stairs-Rails: Right Home Layout: One level Home Equipment: Environmental consultant - 2 wheels      Prior Function Level of Independence: Independent          PT Goals (current goals can now be found in the care plan section) Acute Rehab PT Goals PT Goal Formulation: Patient unable to participate in goal setting Time For Goal Achievement: 12/30/16 Potential to Achieve Goals: Fair Progress towards PT goals: Progressing toward goals    Frequency    BID      PT Plan Current plan remains appropriate    Co-evaluation              AM-PAC PT "6 Clicks" Daily Activity  Outcome Measure  Difficulty turning over in bed (including adjusting bedclothes, sheets and blankets)?: Total Difficulty moving from lying on back to sitting on the side of the bed? : Total Difficulty sitting down on and standing up from a chair with arms (e.g., wheelchair, bedside commode, etc,.)?: Total Help needed moving to and from a bed to chair (including a wheelchair)?: Total Help needed walking in hospital room?: Total Help needed climbing 3-5 steps with a railing? : Total 6 Click Score: 6    End of Session Equipment Utilized During Treatment: Gait belt Activity Tolerance: Patient limited by pain;Patient limited by fatigue Patient left: in bed;with bed alarm set;with SCD's reapplied;with family/visitor present;with call bell/phone within reach Nurse Communication: Mobility status PT Visit Diagnosis: Other abnormalities of gait and mobility (R26.89);Muscle weakness (generalized) (M62.81)     Time: 4098-1191 PT Time Calculation (min) (ACUTE ONLY): 34 min  Charges:  $Therapeutic Exercise: 8-22 mins $Therapeutic Activity: 8-22 mins                    G Codes:       DElly Modena PT, DPT 12/17/16, 3:16 PM

## 2016-12-17 NOTE — Progress Notes (Signed)
[facility] [place] [facility]                                                                                                                                                                                  Patient Demographics   Curtis Nunez, is a 83 y.o. male, DOB - 03-Dec-1926, [id number]  Admit date - 12/16/2016   Admitting Physician Ramonita LabAruna Gouru, MD  Outpatient Primary MD for the patient is Feldpausch, Madaline Guthrieale E, MD   LOS - 1  Subjective: Patient currently asymptomatic was confused last night    Review of Systems:   CONSTITUTIONAL: Unable to provide due to dementia  Vitals:   Vitals:   12/17/16 0045 12/17/16 0346 12/17/16 0731 12/17/16 1155  BP: (!) 145/77 135/71 (!) 162/64   Pulse: (!) 54 (!) 56 (!) 55 (!) 58  Resp:  18 18   Temp:  98.2 F (36.8 C) 98 F (36.7 C)   TempSrc:  Axillary Axillary   SpO2:  99% 92% 95%  Weight:      Height:        Wt Readings from Last 3 Encounters:  12/16/16 155 lb (70.3 kg)     Intake/Output Summary (Last 24 hours) at 12/17/16 1314 Last data filed at 12/17/16 0359  Gross per 24 hour  Intake          1676.25 ml  Output              775 ml  Net           901.25 ml    Physical Exam:   GENERAL: Pleasant-appearing in no apparent distress.  HEAD, EYES, EARS, NOSE AND THROAT: Atraumatic, normocephalic. Extraocular muscles are intact. Pupils equal and reactive to light. Sclerae anicteric. No conjunctival injection. No oro-pharyngeal erythema.  NECK: Supple. There is no jugular venous distention. No bruits, no lymphadenopathy, no thyromegaly.  HEART: Regular rate and rhythm,. No murmurs, no rubs, no clicks.  LUNGS: Clear to auscultation bilaterally. No rales or rhonchi. No wheezes.  ABDOMEN: Soft, flat, nontender, nondistended. Has good bowel sounds. No hepatosplenomegaly appreciated.  EXTREMITIES: No evidence of any cyanosis, clubbing, or peripheral edema.  +2 pedal and radial pulses bilaterally.  NEUROLOGIC: The  patient is alert, awake, and oriented x3 with no focal motor or sensory deficits appreciated bilaterally.  SKIN: Moist and warm with no rashes appreciated.  Psych: Not anxious, depressed LN: No inguinal LN enlargement    Antibiotics   Anti-infectives    Start     Dose/Rate Route Frequency Ordered Stop   12/16/16 2300  ceFAZolin (ANCEF) IVPB 2g/100 mL premix     2 g 200 mL/hr over 30  Minutes Intravenous Every 6 hours 12/16/16 1947 12/17/16 1155   12/16/16 1830  ceFAZolin (ANCEF) IVPB 2g/100 mL premix     2 g 200 mL/hr over 30 Minutes Intravenous  Once 12/16/16 1317        Medications   Scheduled Meds: . acetaminophen  1,000 mg Oral Q6H  . allopurinol  100 mg Oral Daily  . atenolol  12.5 mg Oral Daily  . docusate sodium  100 mg Oral BID  . enoxaparin (LOVENOX) injection  40 mg Subcutaneous Q24H  . famotidine  20 mg Oral Daily  . omega-3 acid ethyl esters  1 capsule Oral Daily  . pantoprazole  40 mg Oral Daily  . sodium chloride flush  3 mL Intravenous Q12H   Continuous Infusions: .  ceFAZolin (ANCEF) IV    . dextrose 5 % and 0.9 % NaCl with KCl 20 mEq/L 75 mL/hr at 12/16/16 2138   PRN Meds:.acetaminophen **OR** acetaminophen, bisacodyl, diphenhydrAMINE, HYDROmorphone (DILAUDID) injection, ketorolac, magnesium hydroxide, metoCLOPramide **OR** metoCLOPramide (REGLAN) injection, metoprolol tartrate, ondansetron **OR** ondansetron (ZOFRAN) IV, oxyCODONE, senna-docusate, sodium phosphate, traMADol   Data Review:   Micro Results No results found for this or any previous visit (from the past 240 hour(s)).  Radiology Reports Dg Chest 1 View  Result Date: 12/16/2016 CLINICAL DATA:  Proper evaluation, hip fracture, history hypertension, former smoker EXAM: CHEST 1 VIEW COMPARISON:  Portable exam at 1132 hours correlated with prior CT chest 07/01/2006 FINDINGS: Enlargement of cardiac silhouette. Atherosclerotic calcification aorta. Mediastinal contours and pulmonary vascularity  normal. Bibasilar atelectasis. Lungs otherwise clear. No pleural effusion or pneumothorax. Bones demineralized. IMPRESSION: Enlargement cardiac silhouette. Bibasilar atelectasis. Aortic atherosclerosis. Electronically Signed   By: Ulyses Southward M.D.   On: 12/16/2016 11:47   Dg Hip Unilat W Or W/o Pelvis 2-3 Views Right  Result Date: 12/16/2016 CLINICAL DATA:  Right hip hemiarthroplasty. EXAM: DG HIP (WITH OR WITHOUT PELVIS) 2-3V RIGHT COMPARISON:  12/16/2016 FINDINGS: AP view of the lower pelvis with cross-table lateral view of the right hip shows the patient be status post left hip hemiarthroplasty. No evidence for immediate hardware complications. Bones are diffusely demineralized. IMPRESSION: Status post right hip hemiarthroplasty without evidence for immediate hardware complications. Electronically Signed   By: Kennith Center M.D.   On: 12/16/2016 20:39   Dg Hip Unilat W Or Wo Pelvis 2-3 Views Right  Result Date: 12/16/2016 CLINICAL DATA:  Fall, right hip pain EXAM: DG HIP (WITH OR WITHOUT PELVIS) 2-3V RIGHT COMPARISON:  None. FINDINGS: There is a right femoral neck fracture with varus angulation. No subluxation or dislocation. IMPRESSION: Right femoral neck fracture with varus angulation. Electronically Signed   By: Charlett Nose M.D.   On: 12/16/2016 11:08     CBC  Recent Labs Lab 12/16/16 1322 12/17/16 0427  WBC 11.4* 11.6*  HGB 13.9 11.3*  HCT 41.3 33.0*  PLT 180 155  MCV 94.5 94.3  MCH 31.7 32.3  MCHC 33.6 34.3  RDW 14.0 14.4  LYMPHSABS 1.3 1.5  MONOABS 0.8 0.8  EOSABS 0.5 0.5  BASOSABS 0.0 0.0    Chemistries   Recent Labs Lab 12/16/16 1322 12/17/16 0427  NA 140 142  K 4.5 4.3  CL 108 111  CO2 25 24  GLUCOSE 114* 122*  BUN 22* 22*  CREATININE 1.13 1.26*  CALCIUM 8.7* 8.0*  AST  --  23  ALT  --  12*  ALKPHOS  --  66  BILITOT  --  0.7   ------------------------------------------------------------------------------------------------------------------ estimated  creatinine clearance  is 37.9 mL/min (A) (by C-G formula based on SCr of 1.26 mg/dL (H)). ------------------------------------------------------------------------------------------------------------------ No results for input(s): HGBA1C in the last 72 hours. ------------------------------------------------------------------------------------------------------------------ No results for input(s): CHOL, HDL, LDLCALC, TRIG, CHOLHDL, LDLDIRECT in the last 72 hours. ------------------------------------------------------------------------------------------------------------------ No results for input(s): TSH, T4TOTAL, T3FREE, THYROIDAB in the last 72 hours.  Invalid input(s): FREET3 ------------------------------------------------------------------------------------------------------------------ No results for input(s): VITAMINB12, FOLATE, FERRITIN, TIBC, IRON, RETICCTPCT in the last 72 hours.  Coagulation profile  Recent Labs Lab 12/16/16 1322  INR 1.15    No results for input(s): DDIMER in the last 72 hours.  Cardiac Enzymes No results for input(s): CKMB, TROPONINI, MYOGLOBIN in the last 168 hours.  Invalid input(s): CK ------------------------------------------------------------------------------------------------------------------ Invalid input(s): POCBNP    Assessment & Plan   Curtis Nunez  is a 83 y.o. male with a known history ofAlzheimer's dementia Hypertension, gout and depression is brought into the ED after he sustained a fall at home. Patient has chronic dementia and could not give any meaningful history. X-ray of the hip has revealed a right femoral neck fracture  #Right femoral neck fracture Status post repair Physical therapy as tolerated  #Hypertension Intermittently elevated Continue atenolol I will add IV hydralazine when necessary   #Chronic history of gout No exacerbation at this time continue allopurinol, his home medication  #Chronic history of  Alzheimer's dementia Patient is not on any home medications. Monitor clinically  #Insulin use Lovenox for DVT prophylaxis       Code Status Orders        Start     Ordered   12/16/16 1948  Full code  Continuous     12/16/16 1947    Code Status History    Date Active Date Inactive Code Status Order ID Comments User Context   12/16/2016  7:47 PM 12/16/2016  7:47 PM Full Code [id number]  Ramonita Lab, MD Inpatient    Advance Directive Documentation     Most Recent Value  Type of Advance Directive  Healthcare Power of Attorney  Pre-existing out of facility DNR order (yellow form or pink MOST form)  -  "MOST" Form in Place?  -           Consults  ortohpedics  DVT Prophylaxis  Lovenox   Lab Results  Component Value Date   PLT 155 12/17/2016     Time Spent in minutes    Greater than 50% of time spent in care coordination and counseling patient regarding the condition and plan of care.   Auburn Bilberry M.D on 12/17/2016 at 1:14 PM  Between 7am to 6pm - Pager - [phone]  After 6pm go to [email or web address] - password [email or web address] [place] [facility] [place]  [facility] [place] [facility]   Office  [phone]

## 2016-12-17 NOTE — Progress Notes (Signed)
  Subjective: 1 Day Post-Op Procedure(s) (LRB): ARTHROPLASTY BIPOLAR HIP (HEMIARTHROPLASTY) (Right) Patient reports pain as mild.   Patient is well but has severe dementia PT and care management to assist with discharge planning. Negative for chest pain and shortness of breath Fever: no Gastrointestinal:Negative for nausea and vomiting  Objective: Vital signs in last 24 hours: Temp:  [97.1 F (36.2 C)-98.3 F (36.8 C)] 98 F (36.7 C) (06/08 0731) Pulse Rate:  [54-71] 55 (06/08 0731) Resp:  [12-19] 18 (06/08 0731) BP: (108-188)/(62-97) 162/64 (06/08 0731) SpO2:  [91 %-99 %] 92 % (06/08 0731) Weight:  [70.3 kg (155 lb)] 70.3 kg (155 lb) (06/07 0950)  Intake/Output from previous day:  Intake/Output Summary (Last 24 hours) at 12/17/16 0759 Last data filed at 12/17/16 0359  Gross per 24 hour  Intake          1676.25 ml  Output              775 ml  Net           901.25 ml    Intake/Output this shift: No intake/output data recorded.  Labs:  Recent Labs  12/16/16 1322 12/17/16 0427  HGB 13.9 11.3*    Recent Labs  12/16/16 1322 12/17/16 0427  WBC 11.4* 11.6*  RBC 4.37* 3.50*  HCT 41.3 33.0*  PLT 180 155    Recent Labs  12/16/16 1322 12/17/16 0427  NA 140 142  K 4.5 4.3  CL 108 111  CO2 25 24  BUN 22* 22*  CREATININE 1.13 1.26*  GLUCOSE 114* 122*  CALCIUM 8.7* 8.0*    Recent Labs  12/16/16 1322  INR 1.15     EXAM General - Patient is Disorganized, Confused and Lacking Extremity - ABD soft Sensation intact distally Intact pulses distally Dorsiflexion/Plantar flexion intact Incision: moderate drainage Dressing/Incision - blood tinged drainage Motor Function - intact, moving foot and toes well on exam.  Past Medical History:  Diagnosis Date  . Gout   . Hypertension     Assessment/Plan: 1 Day Post-Op Procedure(s) (LRB): ARTHROPLASTY BIPOLAR HIP (HEMIARTHROPLASTY) (Right) Active Problems:   Hip fracture (HCC)  Estimated body mass index  is 24.28 kg/m as calculated from the following:   Height as of this encounter: 5\' 7"  (1.702 m).   Weight as of this encounter: 70.3 kg (155 lb). Advance diet Up with therapy D/C IV fluids when tolerating po intake.  Labs reviewed, Hg 11.3 this AM, K+ 4.3. Up with therapy today.  Will likely need SNF placement upon discharge. CBC and BMP ordered for tomorrow morning.  DVT Prophylaxis - Lovenox, Foot Pumps and TED hose Weight-Bearing as tolerated to right leg  J. Horris LatinoLance McGhee, PA-C [facility] 12/17/2016, 7:59 AM

## 2016-12-17 NOTE — Care Management Note (Signed)
Case Management Note  Patient Details  Name: SHABAZZ MCKEY MRN: [id number] Date of Birth: 1927-05-29  Subjective/Objective:                  Met with patient, his wife, and several other family members to discuss discharge planning. They all would like for patient to go to short-term rehab at discharge. CSW following. He has a rolling walker available at home. Home health list left with wife for review if needed after SNF.  Action/Plan:   No current RNCM needs.   Expected Discharge Date:  12/19/16               Expected Discharge Plan:     In-House Referral:     Discharge planning Services  CM Consult  Post Acute Care Choice:  Home Health Choice offered to:  Patient, Spouse  DME Arranged:    DME Agency:     HH Arranged:    [place] Agency:     Status of Service:  In process, will continue to follow  If discussed at Long Length of Stay Meetings, dates discussed:    Additional Comments:  Marshell Garfinkel, RN 12/17/2016, 12:40 PM

## 2016-12-17 NOTE — Evaluation (Signed)
Physical Therapy Evaluation Patient Details Name: Curtis RudeRalph T Nunez MRN: [id number] DOB: 1927/01/23 Today's Date: 12/17/2016   History of Present Illness  Pt is an 83 y.o. male with a known history of Alzheimer's dementia Hypertension, gout and depression is brought into the ED after he sustained a fall at home. Patient has chronic dementia and could not give any meaningful history.  X-ray of the hip has revealed a right femoral neck fracture. Ortho consulted and hospitalist team is called to admit the patient.  Pt now s/p R hip hemiarthroplasty.     Clinical Impression  Pt presents with significant deficits in strength, transfers, mobility, gait, balance, and activity tolerance.  Pt required max A for all bed mobility tasks and mod A with sit to/from stand transfers from elevated EOB.  Pt required frequent Min A to maintain seated balance at EOB and once in standing pt unable to stand fully upright and would impulsively return to sitting with max standing tolerance < 5 sec.  Pt and family educated on posterior hip precautions and examples given on applications with functional tasks/gait.  Handout provided and reviewed with questions answered.  Pt will benefit from PT services in a SNF setting to address above deficits for decreased caregiver assistance and eventual safe return to prior living setting.        Follow Up Recommendations SNF    Equipment Recommendations  None recommended by PT    Recommendations for Other Services       Precautions / Restrictions Precautions Precautions: Fall;Posterior Hip Precaution Booklet Issued: Yes (comment) Precaution Comments: Posterior hip precaution education provided to pt and family Restrictions Weight Bearing Restrictions: Yes RLE Weight Bearing: Weight bearing as tolerated Other Position/Activity Restrictions: Abd pillow re-donned at end of session      Mobility  Bed Mobility Overal bed mobility: Needs Assistance Bed Mobility: Supine to  Sit;Sit to Supine     Supine to sit: Max assist Sit to supine: Max assist      Transfers Overall transfer level: Needs assistance Equipment used: Rolling walker (2 wheeled) Transfers: Sit to/from Stand Sit to Stand: Mod assist         General transfer comment: Sit to/from stand from elevated EOB to maintain hip precautions; max standing time 3-5 sec with pt unable to stand fully upright  Ambulation/Gait             General Gait Details: Unable  Stairs Stairs:  (Unable/unsafe to attempt)          Wheelchair Mobility    Modified Rankin (Stroke Patients Only)       Balance Overall balance assessment: Needs assistance Sitting-balance support: Feet supported;Bilateral upper extremity supported Sitting balance-Leahy Scale: Poor Sitting balance - Comments: Frequent min A to maintain seated balance at EOB   Standing balance support: Bilateral upper extremity supported Standing balance-Leahy Scale: Poor                               Pertinent Vitals/Pain Pain Assessment:  (Pt unable to rate secondary to cognition)    Home Living Family/patient expects to be discharged to:: Other (Comment) (History provided by family secondary to pt's cognitive deficits ) Living Arrangements: Spouse/significant other Available Help at Discharge: Family;Available 24 hours/day Type of Home: House Home Access: Stairs to enter Entrance Stairs-Rails: Right Entrance Stairs-Number of Steps: 3 Home Layout: One level Home Equipment: Walker - 2 wheels      Prior Function Level  of Independence: Independent               Hand Dominance   Dominant Hand:  (Pt unable to answer, family member unsure)    Extremity/Trunk Assessment        Lower Extremity Assessment Lower Extremity Assessment: Generalized weakness       Communication   Communication: Other (comment) (Verbal communication limited by cognition )  Cognition Arousal/Alertness:  Awake/alert Behavior During Therapy: WFL for tasks assessed/performed Overall Cognitive Status: History of cognitive impairments - at baseline                                        General Comments      Exercises     Assessment/Plan    PT Assessment Patient needs continued PT services  PT Problem List Decreased strength;Decreased activity tolerance;Decreased balance;Decreased mobility       PT Treatment Interventions DME instruction;Gait training;Stair training;Functional mobility training;Neuromuscular re-education;Balance training;Therapeutic exercise;Therapeutic activities;Patient/family education    PT Goals (Current goals can be found in the Care Plan section)  Acute Rehab PT Goals PT Goal Formulation: Patient unable to participate in goal setting Time For Goal Achievement: 12/30/16 Potential to Achieve Goals: Fair    Frequency BID   Barriers to discharge        Co-evaluation               AM-PAC PT "6 Clicks" Daily Activity  Outcome Measure Difficulty turning over in bed (including adjusting bedclothes, sheets and blankets)?: Total Difficulty moving from lying on back to sitting on the side of the bed? : Total Difficulty sitting down on and standing up from a chair with arms (e.g., wheelchair, bedside commode, etc,.)?: Total Help needed moving to and from a bed to chair (including a wheelchair)?: Total Help needed walking in hospital room?: Total Help needed climbing 3-5 steps with a railing? : Total 6 Click Score: 6    End of Session Equipment Utilized During Treatment: Gait belt Activity Tolerance: Patient limited by fatigue;Patient limited by pain Patient left: in bed;with bed alarm set;with SCD's reapplied;with call bell/phone within reach;with family/visitor present;Other (comment) (Hip abd pillow donned) Nurse Communication: Mobility status PT Visit Diagnosis: Other abnormalities of gait and mobility (R26.89);Muscle weakness  (generalized) (M62.81)    Time: 1610-9604 PT Time Calculation (min) (ACUTE ONLY): 35 min   Charges:   PT Evaluation $PT Eval Low Complexity: 1 Procedure PT Treatments $Therapeutic Activity: 8-22 mins   PT G Codes:        DElly Modena PT, DPT 12/17/16, 12:09 PM

## 2016-12-18 LAB — BASIC METABOLIC PANEL
ANION GAP: 6 (ref 5–15)
BUN: 19 mg/dL (ref 6–20)
CHLORIDE: 111 mmol/L (ref 101–111)
CO2: 23 mmol/L (ref 22–32)
Calcium: 7.8 mg/dL — ABNORMAL LOW (ref 8.9–10.3)
Creatinine, Ser: 1.16 mg/dL (ref 0.61–1.24)
GFR calc non Af Amer: 54 mL/min — ABNORMAL LOW (ref >60)
Glucose, Bld: 100 mg/dL — ABNORMAL HIGH (ref 65–99)
POTASSIUM: 4.1 mmol/L (ref 3.5–5.1)
SODIUM: 140 mmol/L (ref 135–145)

## 2016-12-18 LAB — BLOOD GAS, ARTERIAL
Acid-base deficit: 2.3 mmol/L — ABNORMAL HIGH (ref 0.0–2.0)
BICARBONATE: 20.9 mmol/L (ref 20.0–28.0)
FIO2: 21
O2 Saturation: 97.7 %
PATIENT TEMPERATURE: 37
PO2 ART: 95 mmHg (ref 83.0–108.0)
pCO2 arterial: 30 mmHg — ABNORMAL LOW (ref 32.0–48.0)
pH, Arterial: 7.45 (ref 7.350–7.450)

## 2016-12-18 LAB — CBC
HEMATOCRIT: 30.8 % — AB (ref 40.0–52.0)
Hemoglobin: 10.7 g/dL — ABNORMAL LOW (ref 13.0–18.0)
MCH: 33 pg (ref 26.0–34.0)
MCHC: 34.6 g/dL (ref 32.0–36.0)
MCV: 95.3 fL (ref 80.0–100.0)
Platelets: 145 10*3/uL — ABNORMAL LOW (ref 150–440)
RBC: 3.23 MIL/uL — AB (ref 4.40–5.90)
RDW: 14.2 % (ref 11.5–14.5)
WBC: 9.1 10*3/uL (ref 3.8–10.6)

## 2016-12-18 NOTE — Progress Notes (Signed)
PT Cancellation Note  Patient Details Name: Curtis Nunez MRN: [id number] DOB: 07-09-1927   Cancelled Treatment:    Reason Eval/Treat Not Completed: Fatigue/lethargy limiting ability to participate   Attempted PM session but put asleep and did not awaken.  Session held due to lethargy and ability to affectively participate.   Danielle DessSarah Verla Bryngelson 12/18/2016, 1:59 PM

## 2016-12-18 NOTE — Significant Event (Signed)
Rapid Response Event Note  Overview: called for rapid response, pt decreased 02 sats.      Initial Focused Assessment: pt laying in bed asleep, arousable (alzheimers hx), VSS 128/63, pulse 60, resp 16, 02 sats 99% on 3LNC. appears to comfortable, no distress.   Interventions: monitor vital signs  Plan of Care (if not transferred): monitor vital signs, notify if change  Event Summary:   at      at          Curtis Nunez,Curtis Nunez

## 2016-12-18 NOTE — Progress Notes (Signed)
Physical Therapy Treatment Patient Details Name: Curtis Nunez MRN: [id number] DOB: [birth year] Today's Date: 12/18/2016    History of Present Illness Pt is an 83 y.o. male with a known history of Alzheimer's dementia Hypertension, gout and depression is brought into the ED after he sustained a fall at home. Patient has chronic dementia and could not give any meaningful history.  X-ray of the hip has revealed a right femoral neck fracture. Ortho consulted and hospitalist team is called to admit the patient.  Pt now s/p R hip hemiarthroplasty.     PT Comments    Daughter requested to return later when approached earlier for session stating he had a bad night with increased pain.  Returned at 11:00 and she was agreeable for session.  Pt in bed sleeping and was generally agitated with attempts to replace O2 canula and exercises.  During LE ex attempts, pt noted to be inc of urine/BM.  Assisted nursing staff with care.  Pt was dependant for rolling left and right with no active assistance or ability to hold position today.  Pt not appropriate to attempt sitting or out of bed activities today.  Will continue as appropriate.   Follow Up Recommendations  SNF     Equipment Recommendations  None recommended by PT    Recommendations for Other Services       Precautions / Restrictions Precautions Precautions: Fall;Posterior Hip Restrictions Weight Bearing Restrictions: Yes RLE Weight Bearing: Weight bearing as tolerated Other Position/Activity Restrictions: Abd pillow re-donned at end of session    Mobility  Bed Mobility Overal bed mobility: Needs Assistance Bed Mobility: Rolling Rolling: Max assist;Total assist;+2 for physical assistance         General bed mobility comments: rolling for incontinence care  Transfers                 General transfer comment: deferred  Ambulation/Gait                 Stairs            Wheelchair Mobility    Modified Rankin  (Stroke Patients Only)       Balance                                            Cognition Arousal/Alertness: Awake/alert Behavior During Therapy: WFL for tasks assessed/performed Overall Cognitive Status: History of cognitive impairments - at baseline                                        Exercises Other Exercises Other Exercises: Pt resisting ROM exercises this am.  Pt with bilateral LE's flexed even with ABD pillow in place.  Pillow removed in attempts of exercises but pt resisting and with poor tolerance.   Other Exercises: Assisted rolling left/rigth with nursing staff for care.    General Comments        Pertinent Vitals/Pain Pain Assessment: Faces Faces Pain Scale: Hurts whole lot Pain Descriptors / Indicators: Operative site guarding;Moaning Pain Intervention(s): Limited activity within patient's tolerance;Patient requesting pain meds-RN notified    Home Living                      Prior Function  PT Goals (current goals can now be found in the care plan section) Progress towards PT goals: Not progressing toward goals - comment    Frequency    BID      PT Plan Current plan remains appropriate    Co-evaluation              AM-PAC PT "6 Clicks" Daily Activity  Outcome Measure  Difficulty turning over in bed (including adjusting bedclothes, sheets and blankets)?: Total Difficulty moving from lying on back to sitting on the side of the bed? : Total Difficulty sitting down on and standing up from a chair with arms (e.g., wheelchair, bedside commode, etc,.)?: Total Help needed moving to and from a bed to chair (including a wheelchair)?: Total Help needed walking in hospital room?: Total Help needed climbing 3-5 steps with a railing? : Total 6 Click Score: 6    End of Session Equipment Utilized During Treatment: Oxygen Activity Tolerance: Patient limited by pain;Treatment limited secondary to  agitation Patient left: in bed;with bed alarm set;with call bell/phone within reach;with family/visitor present Nurse Communication: Other (comment)       Time: 1610-9604: 1100-1124 PT Time Calculation (min) (ACUTE ONLY): 24 min  Charges:  $Therapeutic Activity: 8-22 mins                    G Codes:       Danielle DessSarah Mamta Rimmer, PTA 12/18/16, 11:33 AM

## 2016-12-18 NOTE — Progress Notes (Signed)
  Subjective: 2 Days Post-Op Procedure(s) (LRB): ARTHROPLASTY BIPOLAR HIP (HEMIARTHROPLASTY) (Right) Unable to obtain a pain score. Patient is well but has severe dementia Refused po meds last night. Plan is for SNF following discharge. Negative for chest pain and shortness of breath Fever: no Gastrointestinal:Negative for nausea and vomiting  Objective: Vital signs in last 24 hours: Temp:  [97.7 F (36.5 C)-98 F (36.7 C)] 98 F (36.7 C) (06/09 0751) Pulse Rate:  [54-104] 104 (06/09 0751) Resp:  [19-20] 19 (06/09 0751) BP: (122-171)/(57-63) 122/57 (06/08 2333) SpO2:  [81 %-96 %] 81 % (06/09 0751)  Intake/Output from previous day:  Intake/Output Summary (Last 24 hours) at 12/18/16 0809 Last data filed at 12/17/16 1827  Gross per 24 hour  Intake              600 ml  Output                0 ml  Net              600 ml    Intake/Output this shift: No intake/output data recorded.  Labs:  Recent Labs  12/16/16 1322 12/17/16 0427 12/18/16 0304  HGB 13.9 11.3* 10.7*    Recent Labs  12/17/16 0427 12/18/16 0304  WBC 11.6* 9.1  RBC 3.50* 3.23*  HCT 33.0* 30.8*  PLT 155 145*    Recent Labs  12/17/16 0427 12/18/16 0304  NA 142 140  K 4.3 4.1  CL 111 111  CO2 24 23  BUN 22* 19  CREATININE 1.26* 1.16  GLUCOSE 122* 100*  CALCIUM 8.0* 7.8*    Recent Labs  12/16/16 1322  INR 1.15     EXAM General - Patient is resting comfortably this AM. Extremity - ABD soft Intact pulses distally Dorsiflexion/Plantar flexion intact Incision: scant drainage Dressing/Incision - blood tinged drainage Motor Function - intact, moving foot and toes well on exam.  Past Medical History:  Diagnosis Date  . Gout   . Hypertension     Assessment/Plan: 2 Days Post-Op Procedure(s) (LRB): ARTHROPLASTY BIPOLAR HIP (HEMIARTHROPLASTY) (Right) Active Problems:   Hip fracture (HCC)  Estimated body mass index is 24.28 kg/m as calculated from the following:   Height as of  this encounter: 5\' 7"  (1.702 m).   Weight as of this encounter: 70.3 kg (155 lb). Advance diet Up with therapy D/C IV fluids when tolerating po intake.  Labs reviewed, Hg 10.7 this AM, K+ 4.3. Up with therapy today.  Begin working on having a BM. CBC and BMP ordered for tomorrow morning. Plan will be for discharge when medically appropriate.  DVT Prophylaxis - Lovenox, Foot Pumps and TED hose Weight-Bearing as tolerated to right leg  J. Horris LatinoLance Marai Teehan, PA-C [facility] 12/18/2016, 8:09 AM

## 2016-12-18 NOTE — Progress Notes (Signed)
Patient is sleeping soundly this a.m. According to granddaughter, who is bedside, patient did not get to sleep until 0630. Requested nurse to let him rest.

## 2016-12-18 NOTE — Progress Notes (Signed)
Pt. Has refused PO meds all night spitting them out at me. IV dilaudid has controlled his pain. Pt. Received suppository for bowel movement this morning. Will continue to monitor.

## 2016-12-18 NOTE — Progress Notes (Signed)
CH received page from Ortho for Rapid Response. Upon arrival to room 143, code was lifted as PT was alert and vital signs returned to a level that the response team was good with. CH offered emotional support to family and staff.    12/18/16 1435  Clinical Encounter Type  Visited With Patient and family together  Visit Type Code  Referral From Nurse  Consult/Referral To Chaplain  Spiritual Encounters  Spiritual Needs Emotional

## 2016-12-18 NOTE — Progress Notes (Signed)
Called rapid response d/t lethargy and low o2 sats of 78%-80%. Sats slowly improved to 100% and patient was arousable.

## 2016-12-18 NOTE — Progress Notes (Signed)
Rapid Response called to room 143 for decrease in 02 sats. Patient resting supine in bed sleeping. VSS, 02 sats 99%-100% on 02@ 3LNC. Patient opens eyes and responds when spoken to. Patient is 1 day post op right hip surgery. No acute distress noted. Primary RN will continue to monitor and report any changes or concerns.

## 2016-12-18 NOTE — Progress Notes (Signed)
[employer] [facility] at [facility]                                                                                                                                                                                  Patient Demographics   Curtis Nunez, is a 83 y.o. male, DOB - 03-04-1927, ZOX:[id number]  Admit date - 12/16/2016   Admitting Physician Curtis LabAruna Gouru, MD  Outpatient Primary MD for the patient is Feldpausch, Madaline Guthrieale E, MD   LOS - 2  Subjective: Noted last night events, patient spit up all by mouth medicines, received IV Dilaudid.now confused.granddaughter at bedside. Needs 3 night  stay for going to rehabilitation and today's the third night.    Review of Systems:   CONSTITUTIONAL: Unable to provide due to dementia  Vitals:   Vitals:   12/17/16 1438 12/17/16 1504 12/17/16 2333 12/18/16 0751  BP: (!) 171/63  (!) 122/57   Pulse: (!) 54 (!) 55 (!) 57 (!) 104  Resp:   20 19  Temp: 97.7 F (36.5 C)  98 F (36.7 C) 98 F (36.7 C)  TempSrc: Oral  Oral Oral  SpO2: 96% 95% 96% (!) 81%  Weight:      Height:        Wt Readings from Last 3 Encounters:  12/16/16 70.3 kg (155 lb)     Intake/Output Summary (Last 24 hours) at 12/18/16 0816 Last data filed at 12/17/16 1827  Gross per 24 hour  Intake              600 ml  Output                0 ml  Net              600 ml    Physical Exam:   GENERAL: Pleasant-appearing in no apparent distress.  HEAD, EYES, EARS, NOSE AND THROAT: Atraumatic, normocephalic. Extraocular muscles are intact. Pupils equal and reactive to light. Sclerae anicteric. No conjunctival injection. No oro-pharyngeal erythema.  NECK: Supple. There is no jugular venous distention. No bruits, no lymphadenopathy, no thyromegaly.  HEART: Regular rate and rhythm,. No murmurs, no rubs, no clicks.  LUNGS: Clear to auscultation bilaterally. No rales or rhonchi. No wheezes.  ABDOMEN: Soft, flat, nontender, nondistended. Has good bowel sounds. No  hepatosplenomegaly appreciated.  EXTREMITIES: No evidence of any cyanosis, clubbing, or peripheral edema.  +2 pedal and radial pulses bilaterally.  NEUROLOGIC: Confused but not sleepy he is awake but not oriented because of underlying dementia SKIN: Moist and warm with no rashes appreciated.  Psych: Not anxious, depressed LN: No inguinal LN enlargement    Antibiotics   Anti-infectives  Start     Dose/Rate Route Frequency Ordered Stop   12/16/16 2300  ceFAZolin (ANCEF) IVPB 2g/100 mL premix     2 g 200 mL/hr over 30 Minutes Intravenous Every 6 hours 12/16/16 1947 12/17/16 1155   12/16/16 1830  ceFAZolin (ANCEF) IVPB 2g/100 mL premix     2 g 200 mL/hr over 30 Minutes Intravenous  Once 12/16/16 1317        Medications   Scheduled Meds: . allopurinol  100 mg Oral Daily  . aspirin EC  81 mg Oral Daily  . atenolol  12.5 mg Oral Daily  . docusate sodium  100 mg Oral BID  . enoxaparin (LOVENOX) injection  40 mg Subcutaneous Q24H  . famotidine  20 mg Oral Daily  . omega-3 acid ethyl esters  1 capsule Oral Daily  . pantoprazole  40 mg Oral Daily  . sodium chloride flush  3 mL Intravenous Q12H   Continuous Infusions: .  ceFAZolin (ANCEF) IV    . dextrose 5 % and 0.9 % NaCl with KCl 20 mEq/L 75 mL/hr at 12/16/16 2138   PRN Meds:.acetaminophen **OR** acetaminophen, bisacodyl, diphenhydrAMINE, HYDROmorphone (DILAUDID) injection, ketorolac, magnesium hydroxide, metoCLOPramide **OR** metoCLOPramide (REGLAN) injection, metoprolol tartrate, ondansetron **OR** ondansetron (ZOFRAN) IV, oxyCODONE, senna-docusate, sodium phosphate, traMADol   Data Review:   Micro Results No results found for this or any previous visit (from the past 240 hour(s)).  Radiology Reports Dg Chest 1 View  Result Date: 12/16/2016 CLINICAL DATA:  Proper evaluation, hip fracture, history hypertension, former smoker EXAM: CHEST 1 VIEW COMPARISON:  Portable exam at 1132 hours correlated with prior CT chest 07/01/2006  FINDINGS: Enlargement of cardiac silhouette. Atherosclerotic calcification aorta. Mediastinal contours and pulmonary vascularity normal. Bibasilar atelectasis. Lungs otherwise clear. No pleural effusion or pneumothorax. Bones demineralized. IMPRESSION: Enlargement cardiac silhouette. Bibasilar atelectasis. Aortic atherosclerosis. Electronically Signed   By: Ulyses Southward M.D.   On: 12/16/2016 11:47   Dg Hip Unilat W Or W/o Pelvis 2-3 Views Right  Result Date: 12/16/2016 CLINICAL DATA:  Right hip hemiarthroplasty. EXAM: DG HIP (WITH OR WITHOUT PELVIS) 2-3V RIGHT COMPARISON:  12/16/2016 FINDINGS: AP view of the lower pelvis with cross-table lateral view of the right hip shows the patient be status post left hip hemiarthroplasty. No evidence for immediate hardware complications. Bones are diffusely demineralized. IMPRESSION: Status post right hip hemiarthroplasty without evidence for immediate hardware complications. Electronically Signed   By: Kennith Center M.D.   On: 12/16/2016 20:39   Dg Hip Unilat W Or Wo Pelvis 2-3 Views Right  Result Date: 12/16/2016 CLINICAL DATA:  Fall, right hip pain EXAM: DG HIP (WITH OR WITHOUT PELVIS) 2-3V RIGHT COMPARISON:  None. FINDINGS: There is a right femoral neck fracture with varus angulation. No subluxation or dislocation. IMPRESSION: Right femoral neck fracture with varus angulation. Electronically Signed   By: Charlett Nose M.D.   On: 12/16/2016 11:08     CBC  Recent Labs Lab 12/16/16 1322 12/17/16 0427 12/18/16 0304  WBC 11.4* 11.6* 9.1  HGB 13.9 11.3* 10.7*  HCT 41.3 33.0* 30.8*  PLT 180 155 145*  MCV 94.5 94.3 95.3  MCH 31.7 32.3 33.0  MCHC 33.6 34.3 34.6  RDW 14.0 14.4 14.2  LYMPHSABS 1.3 1.5  --   MONOABS 0.8 0.8  --   EOSABS 0.5 0.5  --   BASOSABS 0.0 0.0  --     Chemistries   Recent Labs Lab 12/16/16 1322 12/17/16 0427 12/18/16 0304  NA 140 142 140  K  4.5 4.3 4.1  CL 108 111 111  CO2 25 24 23   GLUCOSE 114* 122* 100*  BUN 22* 22* 19   CREATININE 1.13 1.26* 1.16  CALCIUM 8.7* 8.0* 7.8*  AST  --  23  --   ALT  --  12*  --   ALKPHOS  --  66  --   BILITOT  --  0.7  --    ------------------------------------------------------------------------------------------------------------------ estimated creatinine clearance is 41.2 mL/min (by C-G formula based on SCr of 1.16 mg/dL). ------------------------------------------------------------------------------------------------------------------ No results for input(s): HGBA1C in the last 72 hours. ------------------------------------------------------------------------------------------------------------------ No results for input(s): CHOL, HDL, LDLCALC, TRIG, CHOLHDL, LDLDIRECT in the last 72 hours. ------------------------------------------------------------------------------------------------------------------ No results for input(s): TSH, T4TOTAL, T3FREE, THYROIDAB in the last 72 hours.  Invalid input(s): FREET3 ------------------------------------------------------------------------------------------------------------------ No results for input(s): VITAMINB12, FOLATE, FERRITIN, TIBC, IRON, RETICCTPCT in the last 72 hours.  Coagulation profile  Recent Labs Lab 12/16/16 1322  INR 1.15    No results for input(s): DDIMER in the last 72 hours.  Cardiac Enzymes No results for input(s): CKMB, TROPONINI, MYOGLOBIN in the last 168 hours.  Invalid input(s): CK ------------------------------------------------------------------------------------------------------------------ Invalid input(s): POCBNP    Assessment & Plan   Curtis Nunez  is a 83 y.o. male with a known history ofAlzheimer's dementia Hypertension, gout and depression is brought into the ED after he sustained a fall at home. Patient has chronic dementia and could not give any meaningful history. X-ray of the hip has revealed a right femoral neck fracture  #Right femoral neck fracture Status post  repair Physical therapy as tolerated Needs 3 night stay for rehab placement.  #Hypertension Intermittently elevated Continue atenolol I will add IV hydralazine when necessary   #Chronic history of gout No exacerbation at this time continue allopurinol, his home medication  #Chronic history of Alzheimer's dementia Patient is not on any home medications. Monitor clinically  #Insulin use Lovenox for DVT prophylaxis dementia and spitting all meds  Last  Night , use IV Dilaudid as needed patient's symptoms are due to recent fracture, hospital stay because   Of dementia,     Code Status Orders        Start     Ordered   12/16/16 1948  Full code  Continuous     12/16/16 1947    Code Status History    Date Active Date Inactive Code Status Order ID Comments User Context   12/16/2016  7:47 PM 12/16/2016  7:47 PM Full Code [id number]  Curtis Lab, MD Inpatient    Advance Directive Documentation     Most Recent Value  Type of Advance Directive  Healthcare Power of Attorney  Pre-existing out of facility DNR order (yellow form or pink MOST form)  -  "MOST" Form in Place?  -           Consults  ortohpedics  DVT Prophylaxis  Lovenox   Lab Results  Component Value Date   PLT 145 (L) 12/18/2016     Time Spent in minutes    Greater than 50% of time spent in care coordination and counseling patient regarding the condition and plan of care.   Katha Hamming M.D on 12/18/2016 at 8:16 AM  Between 7am to 6pm - Pager - [phone]  After 6pm go to [email or web address] - password [email or web address] [facility]  [facility]   Office  [phone]

## 2016-12-19 ENCOUNTER — Inpatient Hospital Stay: Payer: PPO

## 2016-12-19 LAB — URINALYSIS, COMPLETE (UACMP) WITH MICROSCOPIC
BILIRUBIN URINE: NEGATIVE
Bacteria, UA: NONE SEEN
Glucose, UA: NEGATIVE mg/dL
LEUKOCYTES UA: NEGATIVE
NITRITE: NEGATIVE
PH: 5.5 (ref 5.0–8.0)
Protein, ur: NEGATIVE mg/dL
SPECIFIC GRAVITY, URINE: 1.02 (ref 1.005–1.030)

## 2016-12-19 LAB — BASIC METABOLIC PANEL
ANION GAP: 5 (ref 5–15)
BUN: 20 mg/dL (ref 6–20)
CALCIUM: 8 mg/dL — AB (ref 8.9–10.3)
CO2: 25 mmol/L (ref 22–32)
CREATININE: 1.19 mg/dL (ref 0.61–1.24)
Chloride: 109 mmol/L (ref 101–111)
GFR, EST NON AFRICAN AMERICAN: 53 mL/min — AB (ref >60)
Glucose, Bld: 91 mg/dL (ref 65–99)
Potassium: 4 mmol/L (ref 3.5–5.1)
SODIUM: 139 mmol/L (ref 135–145)

## 2016-12-19 MED ORDER — KCL IN DEXTROSE-NACL 20-5-0.9 MEQ/L-%-% IV SOLN
INTRAVENOUS | Status: DC
  Administered 2016-12-19 – 2016-12-20 (×2): via INTRAVENOUS
  Filled 2016-12-19 (×3): qty 1000

## 2016-12-19 MED ORDER — KCL IN DEXTROSE-NACL 10-5-0.45 MEQ/L-%-% IV SOLN: INTRAVENOUS | Status: DC

## 2016-12-19 NOTE — Progress Notes (Signed)
  Subjective: 3 Days Post-Op Procedure(s) (LRB): ARTHROPLASTY BIPOLAR HIP (HEMIARTHROPLASTY) (Right) Unable to obtain a pain score. Patient is well but has severe dementia Refused po meds last night. Plan is for SNF following discharge. Due to recent drowsiness and unresponsiveness, the patient was being taken down for a CT scan when I was entering the room.  Objective: Vital signs in last 24 hours: Temp:  [97.5 F (36.4 C)-98.8 F (37.1 C)] 97.5 F (36.4 C) (06/10 0733) Pulse Rate:  [57-60] 57 (06/10 0733) Resp:  [18-19] 18 (06/10 0733) BP: (167-189)/(71-83) 189/83 (06/10 0733) SpO2:  [97 %-100 %] 100 % (06/10 0733)  Intake/Output from previous day:  Intake/Output Summary (Last 24 hours) at 12/19/16 0810 Last data filed at 12/18/16 2259  Gross per 24 hour  Intake               30 ml  Output                0 ml  Net               30 ml    Intake/Output this shift: No intake/output data recorded.  Labs:  Recent Labs  12/16/16 1322 12/17/16 0427 12/18/16 0304  HGB 13.9 11.3* 10.7*    Recent Labs  12/17/16 0427 12/18/16 0304  WBC 11.6* 9.1  RBC 3.50* 3.23*  HCT 33.0* 30.8*  PLT 155 145*    Recent Labs  12/18/16 0304 12/19/16 0335  NA 140 139  K 4.1 4.0  CL 111 109  CO2 23 25  BUN 19 20  CREATININE 1.16 1.19  GLUCOSE 100* 91  CALCIUM 7.8* 8.0*    Recent Labs  12/16/16 1322  INR 1.15     EXAM General - Patient is resting comfortably, being taken down to radiology for a CT scan upon entering the room. Extremity - ABD soft Intact pulses distally Dorsiflexion/Plantar flexion intact Incision: scant drainage Dressing/Incision - blood tinged drainage Motor Function - intact, moving foot and toes well on exam.  Past Medical History:  Diagnosis Date  . Gout   . Hypertension     Assessment/Plan: 3 Days Post-Op Procedure(s) (LRB): ARTHROPLASTY BIPOLAR HIP (HEMIARTHROPLASTY) (Right) Active Problems:   Hip fracture (HCC)  Estimated body mass  index is 24.28 kg/m as calculated from the following:   Height as of this encounter: 5\' 7"  (1.702 m).   Weight as of this encounter: 70.3 kg (155 lb). Advance diet Up with therapy   Labs reviewed this AM. Up with therapy today.  Begin working on having a BM. Due to increased drowsiness, CT scan of brain being performed to r/o acute CVA.  Will decrease pain medications at this time, patient was receiving dilaudid due to him not taking any oral pain medications.  Will transition to tylenol today. Plan will be for discharge when medically appropriate.  Upon discharge from the hospital, continue Lovenox 40mg  x 14 days once daily. Staples can be removed on 12/30/16, follow-up with [facility] in 6 weeks for repeat x-rays of the right hip.  DVT Prophylaxis - Lovenox, Foot Pumps and TED hose Weight-Bearing as tolerated to right leg  J. Horris LatinoLance Shulamit Donofrio, PA-C [facility] 12/19/2016, 8:10 AM

## 2016-12-19 NOTE — Progress Notes (Signed)
Physical Therapy Treatment Patient Details Name: Curtis Nunez MRN: [id number] DOB: 1927-06-30 Today's Date: 12/19/2016    History of Present Illness Pt is an 83 y.o. male with a known history of Alzheimer's dementia Hypertension, gout and depression is brought into the ED after he sustained a fall at home. Patient has chronic dementia and could not give any meaningful history.  X-ray of the hip has revealed a right femoral neck fracture. Ortho consulted and hospitalist team is called to admit the patient.  Pt now s/p R hip hemiarthroplasty.     PT Comments    Awake and overall improved cognition today.  Engaged with session.  Participated in exercises as described below.  While LE's initially stiff, pt did not fight or resist exercises today.  To edge of bed with mod a x 2 and good initiation by pt.  Sitting EOB with min assist to prevent LOB.  Stood x 1 with walker and mod a x 2 but soon sat back on bed.  On second attempt he was able to transfer to recliner at bedside with mod a x 2.  Abduction pillow donned in chair.     Follow Up Recommendations  SNF     Equipment Recommendations  None recommended by PT    Recommendations for Other Services       Precautions / Restrictions Precautions Precautions: Fall;Posterior Hip Precaution Booklet Issued: Yes (comment) Precaution Comments: Posterior hip precaution education provided to pt and family Restrictions Weight Bearing Restrictions: Yes RLE Weight Bearing: Weight bearing as tolerated Other Position/Activity Restrictions: Abd pillow re-donned at end of session    Mobility  Bed Mobility Overal bed mobility: Needs Assistance Bed Mobility: Supine to Sit     Supine to sit: Mod assist;+2 for physical assistance        Transfers Overall transfer level: Needs assistance Equipment used: Rolling walker (2 wheeled) Transfers: Sit to/from Stand Sit to Stand: Mod assist;+2 physical assistance             Ambulation/Gait Ambulation/Gait assistance: Mod assist;+2 physical assistance Ambulation Distance (Feet): 3 Feet Assistive device: Rolling walker (2 wheeled)     Gait velocity interpretation: <1.8 ft/sec, indicative of risk for recurrent falls General Gait Details: Pt able to take several small steps to chair at bedside.  +2 assist needed.   Stairs            Wheelchair Mobility    Modified Rankin (Stroke Patients Only)       Balance Overall balance assessment: Needs assistance Sitting-balance support: Feet supported;Bilateral upper extremity supported Sitting balance-Leahy Scale: Fair     Standing balance support: Bilateral upper extremity supported Standing balance-Leahy Scale: Poor                              Cognition Arousal/Alertness: Awake/alert Behavior During Therapy: WFL for tasks assessed/performed Overall Cognitive Status: Within Functional Limits for tasks assessed                                        Exercises Total Joint Exercises Ankle Circles/Pumps: AAROM;Both;10 reps Short Arc QuadBarbaraann Boys;Both;10 reps;Supine Heel Slides: AAROM;Supine;Both;10 reps Hip ABduction/ADduction: AAROM;Supine;Both;10 reps Straight Leg Raises: AAROM;Supine;Both;10 reps    General Comments        Pertinent Vitals/Pain Pain Assessment: Faces Faces Pain Scale: Hurts even more Pain Descriptors / Indicators: Operative  site guarding;Moaning Pain Intervention(s): Limited activity within patient's tolerance    Home Living                      Prior Function            PT Goals (current goals can now be found in the care plan section) Progress towards PT goals: Progressing toward goals    Frequency    BID      PT Plan Current plan remains appropriate    Co-evaluation              AM-PAC PT "6 Clicks" Daily Activity  Outcome Measure  Difficulty turning over in bed (including adjusting bedclothes, sheets  and blankets)?: Total Difficulty moving from lying on back to sitting on the side of the bed? : Total Difficulty sitting down on and standing up from a chair with arms (e.g., wheelchair, bedside commode, etc,.)?: Total Help needed moving to and from a bed to chair (including a wheelchair)?: Total Help needed walking in hospital room?: Total Help needed climbing 3-5 steps with a railing? : Total 6 Click Score: 6    End of Session Equipment Utilized During Treatment: Gait belt;Oxygen Activity Tolerance: Patient tolerated treatment well Patient left: in chair;with chair alarm set;with call bell/phone within reach;with family/visitor present Nurse Communication: Mobility status       Time: [id number] PT Time Calculation (min) (ACUTE ONLY): 23 min  Charges:  $Therapeutic Exercise: 8-22 mins $Therapeutic Activity: 8-22 mins                    G Codes:       Danielle DessSarah Riyan Gavina, PTA 12/19/16, 12:35 PM

## 2016-12-19 NOTE — Progress Notes (Signed)
[facility] - [place] [facility]                                                                                                                                                                                  Patient Demographics   Curtis Nunez, is a 83 y.o. male, DOB - Mar 22, 1927, QMV:[id number]  Admit date - 12/16/2016   Admitting Physician Ramonita LabAruna Gouru, MD  Outpatient Primary MD for the patient is Feldpausch, Madaline Guthrieale E, MD   LOS - 3  Subjective: Patient has poor by mouth intake since yesterday and also confused. According to family he has dementia and episodes of confusion.rapid  Response was called yesterday because of episode of hypoxia, ABG was normal without CO2 retention. Patient opened eyes by calling his name today but unable to  Talk.  Review of Systems:   CONSTITUTIONAL: Unable to provide due to dementia  Vitals:   Vitals:   12/18/16 0751 12/18/16 1455 12/18/16 2339 12/19/16 0733  BP:  (!) 178/71 (!) 167/73 (!) 189/83  Pulse: (!) 104 (!) 58 60 (!) 57  Resp: 19  19 18   Temp: 98 F (36.7 C) 98 F (36.7 C) 98.8 F (37.1 C) 97.5 F (36.4 C)  TempSrc: Oral Axillary Oral Oral  SpO2: (!) 81% 100% 97% 100%  Weight:      Height:        Wt Readings from Last 3 Encounters:  12/16/16 70.3 kg (155 lb)     Intake/Output Summary (Last 24 hours) at 12/19/16 [id number] Last data filed at 12/18/16 2259  Gross per 24 hour  Intake               30 ml  Output                0 ml  Net               30 ml    Physical Exam:   GENERAL: Pleasant-appearing in no apparent distress.  HEAD, EYES, EARS, NOSE AND THROAT: Atraumatic, normocephalic. Extraocular muscles are intact. Pupils equal and reactive to light. Sclerae anicteric. No conjunctival injection. No oro-pharyngeal erythema.  NECK: Supple. There is no jugular venous distention. No bruits, no lymphadenopathy, no thyromegaly.  HEART: Regular rate and rhythm,. No murmurs, no rubs, no clicks.  LUNGS: Clear to  auscultation bilaterally. No rales or rhonchi. No wheezes.  ABDOMEN: Soft, flat, nontender, nondistended. Has good bowel sounds. No hepatosplenomegaly appreciated.  EXTREMITIES: No evidence of any cyanosis, clubbing, or peripheral edema.  +2 pedal and radial pulses bilaterally.  NEUROLOGIC: Confused but not sleepy he is awake but not oriented because of underlying dementia SKIN: Moist and warm  with no rashes appreciated.  Psych: Not anxious, depressed LN: No inguinal LN enlargement    Antibiotics   Anti-infectives    Start     Dose/Rate Route Frequency Ordered Stop   12/16/16 2300  ceFAZolin (ANCEF) IVPB 2g/100 mL premix     2 g 200 mL/hr over 30 Minutes Intravenous Every 6 hours 12/16/16 1947 12/17/16 1155   12/16/16 1830  ceFAZolin (ANCEF) IVPB 2g/100 mL premix     2 g 200 mL/hr over 30 Minutes Intravenous  Once 12/16/16 1317        Medications   Scheduled Meds: . allopurinol  100 mg Oral Daily  . aspirin EC  81 mg Oral Daily  . atenolol  12.5 mg Oral Daily  . docusate sodium  100 mg Oral BID  . enoxaparin (LOVENOX) injection  40 mg Subcutaneous Q24H  . famotidine  20 mg Oral Daily  . omega-3 acid ethyl esters  1 capsule Oral Daily  . pantoprazole  40 mg Oral Daily  . sodium chloride flush  3 mL Intravenous Q12H   Continuous Infusions: .  ceFAZolin (ANCEF) IV    . dextrose 5 % and 0.9 % NaCl with KCl 20 mEq/L     PRN Meds:.acetaminophen **OR** acetaminophen, bisacodyl, diphenhydrAMINE, ketorolac, magnesium hydroxide, metoCLOPramide **OR** metoCLOPramide (REGLAN) injection, metoprolol tartrate, ondansetron **OR** ondansetron (ZOFRAN) IV, senna-docusate, sodium phosphate   Data Review:   Micro Results No results found for this or any previous visit (from the past 240 hour(s)).  Radiology Reports Dg Chest 1 View  Result Date: 12/16/2016 CLINICAL DATA:  Proper evaluation, hip fracture, history hypertension, former smoker EXAM: CHEST 1 VIEW COMPARISON:  Portable exam at  1132 hours correlated with prior CT chest 07/01/2006 FINDINGS: Enlargement of cardiac silhouette. Atherosclerotic calcification aorta. Mediastinal contours and pulmonary vascularity normal. Bibasilar atelectasis. Lungs otherwise clear. No pleural effusion or pneumothorax. Bones demineralized. IMPRESSION: Enlargement cardiac silhouette. Bibasilar atelectasis. Aortic atherosclerosis. Electronically Signed   By: Ulyses Southward M.D.   On: 12/16/2016 11:47   Dg Hip Unilat W Or W/o Pelvis 2-3 Views Right  Result Date: 12/16/2016 CLINICAL DATA:  Right hip hemiarthroplasty. EXAM: DG HIP (WITH OR WITHOUT PELVIS) 2-3V RIGHT COMPARISON:  12/16/2016 FINDINGS: AP view of the lower pelvis with cross-table lateral view of the right hip shows the patient be status post left hip hemiarthroplasty. No evidence for immediate hardware complications. Bones are diffusely demineralized. IMPRESSION: Status post right hip hemiarthroplasty without evidence for immediate hardware complications. Electronically Signed   By: Kennith Center M.D.   On: 12/16/2016 20:39   Dg Hip Unilat W Or Wo Pelvis 2-3 Views Right  Result Date: 12/16/2016 CLINICAL DATA:  Fall, right hip pain EXAM: DG HIP (WITH OR WITHOUT PELVIS) 2-3V RIGHT COMPARISON:  None. FINDINGS: There is a right femoral neck fracture with varus angulation. No subluxation or dislocation. IMPRESSION: Right femoral neck fracture with varus angulation. Electronically Signed   By: Charlett Nose M.D.   On: 12/16/2016 11:08     CBC  Recent Labs Lab 12/16/16 1322 12/17/16 0427 12/18/16 0304  WBC 11.4* 11.6* 9.1  HGB 13.9 11.3* 10.7*  HCT 41.3 33.0* 30.8*  PLT 180 155 145*  MCV 94.5 94.3 95.3  MCH 31.7 32.3 33.0  MCHC 33.6 34.3 34.6  RDW 14.0 14.4 14.2  LYMPHSABS 1.3 1.5  --   MONOABS 0.8 0.8  --   EOSABS 0.5 0.5  --   BASOSABS 0.0 0.0  --     Chemistries  Recent Labs Lab 12/16/16 1322 12/17/16 0427 12/18/16 0304 12/19/16 0335  NA 140 142 140 139  K 4.5 4.3 4.1 4.0   CL 108 111 111 109  CO2 25 24 23 25   GLUCOSE 114* 122* 100* 91  BUN 22* 22* 19 20  CREATININE 1.13 1.26* 1.16 1.19  CALCIUM 8.7* 8.0* 7.8* 8.0*  AST  --  23  --   --   ALT  --  12*  --   --   ALKPHOS  --  66  --   --   BILITOT  --  0.7  --   --    ------------------------------------------------------------------------------------------------------------------ estimated creatinine clearance is 40.1 mL/min (by C-G formula based on SCr of 1.19 mg/dL). ------------------------------------------------------------------------------------------------------------------ No results for input(s): HGBA1C in the last 72 hours. ------------------------------------------------------------------------------------------------------------------ No results for input(s): CHOL, HDL, LDLCALC, TRIG, CHOLHDL, LDLDIRECT in the last 72 hours. ------------------------------------------------------------------------------------------------------------------ No results for input(s): TSH, T4TOTAL, T3FREE, THYROIDAB in the last 72 hours.  Invalid input(s): FREET3 ------------------------------------------------------------------------------------------------------------------ No results for input(s): VITAMINB12, FOLATE, FERRITIN, TIBC, IRON, RETICCTPCT in the last 72 hours.  Coagulation profile  Recent Labs Lab 12/16/16 1322  INR 1.15    No results for input(s): DDIMER in the last 72 hours.  Cardiac Enzymes No results for input(s): CKMB, TROPONINI, MYOGLOBIN in the last 168 hours.  Invalid input(s): CK ------------------------------------------------------------------------------------------------------------------ Invalid input(s): POCBNP    Assessment & Plan   Curtis Nunez  is a 83 y.o. male with a known history ofAlzheimer's dementia Hypertension, gout and depression is brought into the ED after he sustained a fall at home. Patient has chronic dementia and could not give any meaningful history.  X-ray of the hip has revealed a right femoral neck fracture  #Right femoral neck fracture Status post repair Physical therapy as tolerated Needs 3 night stay for rehab placement.  #Hypertension Intermittently elevated Continue atenolol I will add IV hydralazine when necessary   #Chronic history of gout No exacerbation at this time continue allopurinol, his home medication  #Chronic history of Alzheimer's dementia Patient is not on any home medications. Monitor clinically Cause of poor by mouth intake, lethargy get a workup for infection and also head CT to evaluate for new stroke, start gentle hydration, his continue Percocet, tramadol, use only Tylenol for pain control. Discussed this with patient's nurse, patient's family. Discussed the CODE STATUS with patient's daughter and she said she will talk to her mom and  Let us know.. Patient has no previous CODE STATUS and the daughter is not sure about his health care living will.  #Insulin use Lovenox for DVT prophylaxis      Code Status Orders        Start     Ordered   12/16/16 1948  Full code  Continuous     12/16/16 1947    Code Status History    Date Active Date Inactive Code Status Order ID Comments User Context   12/16/2016  7:47 PM 12/16/2016  7:47 PM Full Code [id number]  Ramonita Lab, MD Inpatient    Advance Directive Documentation     Most Recent Value  Type of Advance Directive  Healthcare Power of Attorney  Pre-existing out of facility DNR order (yellow form or pink MOST form)  -  "MOST" Form in Place?  -           Consults  ortohpedics  DVT Prophylaxis  Lovenox   Lab Results  Component Value Date   PLT 145 (L) 12/18/2016  Time Spent in minutes    Greater than 50% of time spent in care coordination and counseling patient regarding the condition and plan of care.   Katha Hamming M.D on 12/19/2016 at 8:08 AM  Between 7am to 6pm - Pager - [phone]  After 6pm go to  [email or web address] - password [email or web address] [facility]  [facility]   Office  [phone]

## 2016-12-20 DIAGNOSIS — G309 Alzheimer's disease, unspecified: Secondary | ICD-10-CM | POA: Diagnosis not present

## 2016-12-20 DIAGNOSIS — R1312 Dysphagia, oropharyngeal phase: Secondary | ICD-10-CM | POA: Diagnosis not present

## 2016-12-20 DIAGNOSIS — M109 Gout, unspecified: Secondary | ICD-10-CM | POA: Diagnosis not present

## 2016-12-20 DIAGNOSIS — Z5189 Encounter for other specified aftercare: Secondary | ICD-10-CM | POA: Diagnosis not present

## 2016-12-20 DIAGNOSIS — F329 Major depressive disorder, single episode, unspecified: Secondary | ICD-10-CM | POA: Diagnosis not present

## 2016-12-20 DIAGNOSIS — I10 Essential (primary) hypertension: Secondary | ICD-10-CM | POA: Diagnosis not present

## 2016-12-20 DIAGNOSIS — S72001D Fracture of unspecified part of neck of right femur, subsequent encounter for closed fracture with routine healing: Secondary | ICD-10-CM | POA: Diagnosis not present

## 2016-12-20 DIAGNOSIS — S72009A Fracture of unspecified part of neck of unspecified femur, initial encounter for closed fracture: Secondary | ICD-10-CM | POA: Diagnosis not present

## 2016-12-20 DIAGNOSIS — J90 Pleural effusion, not elsewhere classified: Secondary | ICD-10-CM | POA: Diagnosis not present

## 2016-12-20 DIAGNOSIS — M6281 Muscle weakness (generalized): Secondary | ICD-10-CM | POA: Diagnosis not present

## 2016-12-20 DIAGNOSIS — M1000 Idiopathic gout, unspecified site: Secondary | ICD-10-CM | POA: Diagnosis not present

## 2016-12-20 LAB — SURGICAL PATHOLOGY

## 2016-12-20 MED ORDER — ENOXAPARIN SODIUM 40 MG/0.4ML ~~LOC~~ SOLN: 40.0000 mg | SUBCUTANEOUS | 0 refills | Status: DC

## 2016-12-20 MED ORDER — LEVOFLOXACIN 250 MG PO TABS: 250.0000 mg | ORAL_TABLET | Freq: Every day | ORAL | 0 refills | Status: AC

## 2016-12-20 NOTE — Clinical Social Work Placement (Signed)
   CLINICAL SOCIAL WORK PLACEMENT  NOTE  Date:  12/20/2016  Patient Details  Name: Curtis Nunez MRN: [id number] Date of Birth: 08-30-1926  Clinical Social Work is seeking post-discharge placement for this patient at the Skilled  Nursing Facility level of care (*CSW will initial, date and re-position this form in  chart as items are completed):  Yes   Patient/family provided with [place] Clinical Social Work Department's list of facilities offering this level of care within the geographic area requested by the patient (or if unable, by the patient's family).  Yes   Patient/family informed of their freedom to choose among providers that offer the needed level of care, that participate in Medicare, Medicaid or managed care program needed by the patient, have an available bed and are willing to accept the patient.  Yes   Patient/family informed of [facility]'s ownership interest in [facility] and [facility], as well as of the fact that they are under no obligation to receive care at these facilities.  PASRR submitted to EDS on 12/17/16     PASRR number received on 12/17/16     Existing PASRR number confirmed on       FL2 transmitted to all facilities in geographic area requested by pt/family on 12/17/16     FL2 transmitted to all facilities within larger geographic area on       Patient informed that his/her managed care company has contracts with or will negotiate with certain facilities, including the following:        Yes   Patient/family informed of bed offers received.  Patient chooses bed at  ([facility] )     Physician recommends and patient chooses bed at      Patient to be transferred to  ([facility] ) on 12/20/16.  Patient to be transferred to facility by  [facility] )     Patient family notified on 12/20/16 of transfer.  Name of family member notified:   (Patient's wife Curtis Nunez is at bedside and aware of D/C today. )     PHYSICIAN        Additional Comment:    _______________________________________________ Arryana Tolleson, Darleen CrockerBailey M, LCSW 12/20/2016, 11:01 AM

## 2016-12-20 NOTE — Discharge Summary (Signed)
Curtis Nunez, is a 83 y.o. male  DOB 18-Mar-1927  MRN [id number].  Admission date:  12/16/2016  Admitting Physician  Ramonita Lab, MD  Discharge Date:  12/20/2016   Primary MD  Marina Goodell, MD  Recommendations for primary care physician for things to follow:  Follow-up with primary doctor in 1 week Follow up with followed up with [facility] orthopedic in 6 weeks for staple removal.   Admission Diagnosis  Closed displaced fracture of right femoral neck (HCC) [S72.001A] Alzheimer's dementia without behavioral disturbance, unspecified timing of dementia onset [G30.9, F02.80]   Discharge Diagnosis  Closed displaced fracture of right femoral neck (HCC) [S72.001A] Alzheimer's dementia without behavioral disturbance, unspecified timing of dementia onset [G30.9, F02.80]    Active Problems:   Hip fracture [facility])      Past Medical History:  Diagnosis Date  . Gout   . Hypertension     Past Surgical History:  Procedure Laterality Date  . HIP ARTHROPLASTY Right 12/16/2016   Procedure: ARTHROPLASTY BIPOLAR HIP (HEMIARTHROPLASTY);  Surgeon: Christena Flake, MD;  Location: [facility] ORS;  Service: Orthopedics;  Laterality: Right;       History of present illness and  Hospital Course:     Kindly see H&P for history of present illness and admission details, please review complete Labs, Consult reports and Test reports for all details in brief  HPI  from the history and physical done on the day of admission 83 year old male patient with Alzheimer's dementia, hypertension, gout, depression brought in because of the fall at home and patient suffered a right femoral neck fracture. Admitted to medical service.   Hospital Course  1 right femoral neck fracture caused by fall. Patient admitted to medical service, started on pain medicines,  seen by orthopedic, patient had right hip arthroplasty on June 7. After the operation patient received a IV Dilaudid but after IV Dilaudid patient was very sedated and had been hypoxia. ABG didn't show any CO2 retention. And we discontinued IV narcotics and by mouth narcotics and used only Tylenol for pain control. Infection workup has been negative except mild left base atelectasis. Head CAT scan normal. Patient had very good day yesterday and he ate well to discontinuation of IV narcotics and by mouth narcotics. And he also participated with physical therapy yesterday. We are hopeful that patient can go to [facility] today to further continue physical therapy. And this continue all narcotics. Use only Tylenol for pain control. Discussed with patient's family also multiple times. Use Lovenox for 2 weeks for DVT prophylaxis. He is alert awake oriented now. Has baseline dementia.   # #2 dementia #3 essential hypertension; slight bradycardia ,so stopped B blockers  Including  atenolol . Patient can get hydralazine by mouth 25 mg every 6 hours for blood pressure problems.   Discharge Condition: stable   Follow UP   Contact information for follow-up providers    Poggi, Excell Seltzer, MD Follow up in 6 week(s).   Specialty:  Surgery Why:  X-rays of the right hip. Contact information: [address] [facility] [place] [address] [phone]            Contact information for after-discharge care    Destination    [facility] [place] [facility] Follow up.   Specialty:  Skilled Nursing Chief of Staff information: [address] [place] [address] [phone]  Discharge Instructions  and  Discharge Medications      Allergies as of 12/20/2016   No Known Allergies     Medication List    STOP taking these medications   atenolol 25 MG tablet Commonly known as:  TENORMIN     TAKE these medications   allopurinol 100 MG  tablet Commonly known as:  ZYLOPRIM Take by mouth.   aspirin EC 81 MG tablet Take 81 mg by mouth daily.   COCONUT OIL PO Take 1 tablet by mouth daily.   enoxaparin 40 MG/0.4ML injection Commonly known as:  LOVENOX Inject 0.4 mLs (40 mg total) into the skin daily.   FISH OIL OMEGA-3 PO Take 1 tablet by mouth daily.   levofloxacin 250 MG tablet Commonly known as:  LEVAQUIN Take 1 tablet (250 mg total) by mouth daily.         Diet and Activity recommendation: See Discharge Instructions above   Consults obtained - Orthopedic consult   Major procedures and Radiology Reports - PLEASE review detailed and final reports for all details, in brief -      Dg Chest 1 View  Result Date: 12/19/2016 CLINICAL DATA:  Hypoxia EXAM: CHEST 1 VIEW COMPARISON:  December 17, 2016 FINDINGS: There is patchy airspace consolidation in the left lower lobe with small left pleural effusion. Lungs elsewhere are clear. There is cardiomegaly with pulmonary vascularity within normal limits. There is aortic atherosclerosis. No adenopathy. Bones are somewhat osteoporotic. IMPRESSION: Patchy airspace consolidation left lower lobe with small left pleural effusion. Lungs elsewhere clear. Stable cardiac prominence. There is aortic atherosclerosis. Electronically Signed   By: Bretta Bang III M.D.   On: 12/19/2016 09:01   Dg Chest 1 View  Result Date: 12/16/2016 CLINICAL DATA:  Proper evaluation, hip fracture, history hypertension, former smoker EXAM: CHEST 1 VIEW COMPARISON:  Portable exam at 1132 hours correlated with prior CT chest 07/01/2006 FINDINGS: Enlargement of cardiac silhouette. Atherosclerotic calcification aorta. Mediastinal contours and pulmonary vascularity normal. Bibasilar atelectasis. Lungs otherwise clear. No pleural effusion or pneumothorax. Bones demineralized. IMPRESSION: Enlargement cardiac silhouette. Bibasilar atelectasis. Aortic atherosclerosis. Electronically Signed   By: Ulyses Southward M.D.    On: 12/16/2016 11:47   Ct Head Wo Contrast  Result Date: 12/19/2016 CLINICAL DATA:  83 year old male with altered mental status. EXAM: CT HEAD WITHOUT CONTRAST TECHNIQUE: Contiguous axial images were obtained from the base of the skull through the vertex without intravenous contrast. COMPARISON:  10/04/2007 CT FINDINGS: Brain: No evidence of acute infarction, hemorrhage, hydrocephalus, extra-axial collection or mass lesion/mass effect. Atrophy and chronic small-vessel white matter ischemic changes again noted. Vascular: Intracranial atherosclerotic calcifications identified. Skull: Normal. Negative for fracture or focal lesion. Sinuses/Orbits: No acute finding. Other: None. IMPRESSION: No evidence of acute intracranial abnormality. Atrophy and chronic small-vessel white matter ischemic changes. Electronically Signed   By: Harmon Pier M.D.   On: 12/19/2016 08:55   Dg Hip Unilat W Or W/o Pelvis 2-3 Views Right  Result Date: 12/16/2016 CLINICAL DATA:  Right hip hemiarthroplasty. EXAM: DG HIP (WITH OR WITHOUT PELVIS) 2-3V RIGHT COMPARISON:  12/16/2016 FINDINGS: AP view of the lower pelvis with cross-table lateral view of the right hip shows the patient be status post left hip hemiarthroplasty. No evidence for immediate hardware complications. Bones are diffusely demineralized. IMPRESSION: Status post right hip hemiarthroplasty without evidence for immediate hardware complications. Electronically Signed   By: Kennith Center M.D.   On: 12/16/2016 20:39   Dg Hip Unilat W Or Wo Pelvis 2-3 Views  Right  Result Date: 12/16/2016 CLINICAL DATA:  Fall, right hip pain EXAM: DG HIP (WITH OR WITHOUT PELVIS) 2-3V RIGHT COMPARISON:  None. FINDINGS: There is a right femoral neck fracture with varus angulation. No subluxation or dislocation. IMPRESSION: Right femoral neck fracture with varus angulation. Electronically Signed   By: Charlett NoseKevin  Dover M.D.   On: 12/16/2016 11:08    Micro Results     No results found for this or  any previous visit (from the past 240 hour(s)).     Today   Subjective:   [place] CallasRalph Nunez today is Stable for discharge.  Objective:   Blood pressure (!) 131/57, pulse (!) 52, temperature 98.8 F (37.1 C), temperature source Oral, resp. rate 19, height 5\' 7"  (1.702 m), weight 70.3 kg (155 lb), SpO2 100 %.   Intake/Output Summary (Last 24 hours) at 12/20/16 0818 Last data filed at 12/19/16 1628  Gross per 24 hour  Intake           829.17 ml  Output                0 ml  Net           829.17 ml    Exam Awake Alert, Oriented x 3, No new F.N deficits, Normal affect [place].AT,PERRAL Supple Neck,No JVD, No cervical lymphadenopathy appriciated.  Symmetrical Chest wall movement, Good air movement bilaterally, CTAB RRR,No Gallops,Rubs or new Murmurs, No Parasternal Heave +ve B.Sounds, Abd Soft, Non tender, No organomegaly appriciated, No rebound -guarding or rigidity. No Cyanosis, Clubbing or edema, No new Rash or bruise  Data Review   CBC w Diff: Lab Results  Component Value Date   WBC 9.1 12/18/2016   HGB 10.7 (L) 12/18/2016   HCT 30.8 (L) 12/18/2016   PLT 145 (L) 12/18/2016   LYMPHOPCT 13 12/17/2016   MONOPCT 7 12/17/2016   EOSPCT 4 12/17/2016   BASOPCT 0 12/17/2016    CMP: Lab Results  Component Value Date   NA 139 12/19/2016   K 4.0 12/19/2016   CL 109 12/19/2016   CO2 25 12/19/2016   BUN 20 12/19/2016   CREATININE 1.19 12/19/2016   PROT 5.8 (L) 12/17/2016   ALBUMIN 2.7 (L) 12/17/2016   BILITOT 0.7 12/17/2016   ALKPHOS 66 12/17/2016   AST 23 12/17/2016   ALT 12 (L) 12/17/2016  .   Total Time in preparing paper work, data evaluation and todays exam - 35 minutes  Evelene Roussin M.D on 12/20/2016 at 8:18 AM    Note: This dictation was prepared with Dragon dictation along with smaller phrase technology. Any transcriptional errors that result from this process are unintentional.

## 2016-12-20 NOTE — Progress Notes (Signed)
Physical Therapy Treatment Patient Details Name: Curtis RudeRalph T Nunez MRN: [id number] DOB: 1926/10/19 Today's Date: 12/20/2016    History of Present Illness Pt is an 83 y.o. male with a known history of Alzheimer's dementia Hypertension, gout and depression is brought into the ED after he sustained a fall at home. Patient has chronic dementia and could not give any meaningful history.  X-ray of the hip has revealed a right femoral neck fracture. Ortho consulted and hospitalist team is called to admit the patient.  Pt now s/p R hip hemiarthroplasty.     PT Comments    Pt agreeable to PT; cannot answer pain question, but mild facial grimacing noted with verbal confirmation of some pain with Right lower extremity movements. Pt does not demonstrate any active movement with cues and assist to Right lower extremity except at the ankle inconsistently. Active assisted range of motion on the left. Family present with several questions; answered to their satisfaction. Family educated in passive, active assisted range of motion to prevent stiffness and stiffness related pain. Pt to discharge to skilled nursing facility today to continue rehab efforts.    Follow Up Recommendations  SNF     Equipment Recommendations       Recommendations for Other Services       Precautions / Restrictions Precautions Precautions: Fall;Posterior Hip Restrictions Weight Bearing Restrictions: Yes RLE Weight Bearing: Weight bearing as tolerated    Mobility  Bed Mobility               General bed mobility comments: Not tested; pt to prepare for discharge this morning  Transfers                    Ambulation/Gait                 Stairs            Wheelchair Mobility    Modified Rankin (Stroke Patients Only)       Balance                                            Cognition Arousal/Alertness: Awake/alert Behavior During Therapy: WFL for tasks  assessed/performed Overall Cognitive Status: Within Functional Limits for tasks assessed                                        Exercises Total Joint Exercises Ankle Circles/Pumps: AAROM;Both;20 reps;Supine Short Arc Quad: PROM;Right;20 reps;Supine (AAROM L) Heel Slides: PROM;Right;20 reps;Supine (AAROM/AROM L) Hip ABduction/ADduction: PROM;Right;20 reps;Supine (L AAROM)    General Comments        Pertinent Vitals/Pain Pain Assessment: Faces Faces Pain Scale: Hurts little more Pain Location: R hip/LE  (noted with exercise) Pain Intervention(s): Limited activity within patient's tolerance;Monitored during session    Home Living                      Prior Function            PT Goals (current goals can now be found in the care plan section) Progress towards PT goals: Progressing toward goals    Frequency    BID      PT Plan Current plan remains appropriate    Co-evaluation  AM-PAC PT "6 Clicks" Daily Activity  Outcome Measure  Difficulty turning over in bed (including adjusting bedclothes, sheets and blankets)?: Total Difficulty moving from lying on back to sitting on the side of the bed? : Total Difficulty sitting down on and standing up from a chair with arms (e.g., wheelchair, bedside commode, etc,.)?: Total Help needed moving to and from a bed to chair (including a wheelchair)?: Total Help needed walking in hospital room?: Total Help needed climbing 3-5 steps with a railing? : Total 6 Click Score: 6    End of Session Equipment Utilized During Treatment: Oxygen Activity Tolerance: Patient tolerated treatment well Patient left: in bed;with call bell/phone within reach;with nursing/sitter in room;with bed alarm set;Other (comment) (ABD'r wedge in place)         Time: 1610-9604 PT Time Calculation (min) (ACUTE ONLY): 19 min  Charges:  $Therapeutic Exercise: 8-22 mins                    G Codes:       Scot Dock, PTA 12/20/2016, 10:21 AM

## 2016-12-20 NOTE — Discharge Instructions (Signed)
Upon discharge from the hospital, continue Lovenox 40mg  x 14 days once daily. Staples can be removed on 12/29/16, [facility] in 6 weeks for repeat x-rays of the right hip.

## 2016-12-20 NOTE — Discharge Planning (Signed)
Patient IV removed.  Discharge papers printed, given, educated and placed in facility packet.  RN assessment and VS revealed stability for DC to [facility] (s/w Dian SituKristin McCollum, RN to give report).  Patient had BM today, before DC. EMS contacted to transport.  Family in room and aware that patient is going to room 602. Awaiting EMS arrival.

## 2016-12-20 NOTE — Progress Notes (Signed)
  Subjective: 4 Days Post-Op Procedure(s) (LRB): ARTHROPLASTY BIPOLAR HIP (HEMIARTHROPLASTY) (Right) Unable to obtain a pain score. Patient is well but has severe dementia. More alert this AM after decreasing pain medications yesterday. Plan is for SNF following discharge. Negative for chest pain and shortness of breath Fever: no Gastrointestinal:Negative for nausea and vomiting  Objective: Vital signs in last 24 hours: Temp:  [96.4 F (35.8 C)-98.8 F (37.1 C)] 98.8 F (37.1 C) (06/11 0014) Pulse Rate:  [52-58] 52 (06/11 0014) Resp:  [19] 19 (06/11 0014) BP: (106-131)/(54-57) 131/57 (06/11 0014) SpO2:  [98 %-100 %] 100 % (06/11 0014)  Intake/Output from previous day:  Intake/Output Summary (Last 24 hours) at 12/20/16 0743 Last data filed at 12/19/16 1628  Gross per 24 hour  Intake           829.17 ml  Output                0 ml  Net           829.17 ml    Intake/Output this shift: No intake/output data recorded.  Labs:  Recent Labs  12/18/16 0304  HGB 10.7*    Recent Labs  12/18/16 0304  WBC 9.1  RBC 3.23*  HCT 30.8*  PLT 145*    Recent Labs  12/18/16 0304 12/19/16 0335  NA 140 139  K 4.1 4.0  CL 111 109  CO2 23 25  BUN 19 20  CREATININE 1.16 1.19  GLUCOSE 100* 91  CALCIUM 7.8* 8.0*   No results for input(s): LABPT, INR in the last 72 hours.   EXAM General - Patient is resting comfortably this AM.  More alert this AM compared to yesterday morning. Extremity - ABD soft Intact pulses distally Dorsiflexion/Plantar flexion intact Incision: scant drainage with ecchymosis around the incision. Dressing/Incision -Mild blood tinged drainage Motor Function - intact, moving foot and toes well on exam.  Abdomen soft on exam with normal BS.  No tympany this AM.  Past Medical History:  Diagnosis Date  . Gout   . Hypertension     Assessment/Plan: 4 Days Post-Op Procedure(s) (LRB): ARTHROPLASTY BIPOLAR HIP (HEMIARTHROPLASTY) (Right) Active  Problems:   Hip fracture (HCC)  Estimated body mass index is 24.28 kg/m as calculated from the following:   Height as of this encounter: 5\' 7"  (1.702 m).   Weight as of this encounter: 70.3 kg (155 lb). Advance diet Up with therapy  Pt has had a BM. Up with therapy today. More alert after decreasing pain medications yesterday. Plan will be for discharge home this afternoon.  Upon discharge from hospital continue Lovenox 40mg  daily x 14 days. Staples can be removed by SNF on 12/29/16.  [facility].  DVT Prophylaxis - Lovenox, Foot Pumps and TED hose Weight-Bearing as tolerated to right leg  J. Horris LatinoLance Sahian Kerney, PA-C [facility] 12/20/2016, 7:43 AM

## 2016-12-20 NOTE — Progress Notes (Signed)
CH made a follow up visit with Pt who had requested for AD. Pt met Pt who was lying in the bed. Pt's wife, daughter, and granddaughter were at his bedside. Pt was not oriented, [place] informed that family that Pt in his present status was not mentally competent to complete AD. Pt's family agreed and thank [facility] for visiting with Pt.    12/20/16 1200  Clinical Encounter Type  Visited With Patient;Patient and family together  Visit Type Initial;Follow-up;Other (Comment)  Referral From Chaplain  Consult/Referral To Chaplain  Spiritual Encounters  Spiritual Needs Literature;Other (Comment)

## 2016-12-20 NOTE — Progress Notes (Signed)
Patient is medically stable for D/C to [facility] today. Per Jomarie LongsJoseph [facility] liaison patient can come today to room 602. RN will call report to RN Konrad DoloresKim Hicks at [phone] and arrange EMS for transport. Health Team authorization has been received. Clinical Child psychotherapistocial Worker (CSW) sent D/C orders to [facility] via [facility]. Patient's wife Claris GowerCharlotte is at bedside and aware of D/C today. Please reconsult if future social work needs arise. CSW signing off.   [employer] Hughes IncorporatedBailey Jonalyn Sedlak, LCSW [phone]

## 2016-12-23 DIAGNOSIS — I10 Essential (primary) hypertension: Secondary | ICD-10-CM | POA: Diagnosis not present

## 2016-12-23 DIAGNOSIS — F0390 Unspecified dementia without behavioral disturbance: Secondary | ICD-10-CM | POA: Diagnosis not present

## 2016-12-23 DIAGNOSIS — S72001D Fracture of unspecified part of neck of right femur, subsequent encounter for closed fracture with routine healing: Secondary | ICD-10-CM | POA: Diagnosis not present

## 2016-12-23 DIAGNOSIS — R2689 Other abnormalities of gait and mobility: Secondary | ICD-10-CM | POA: Diagnosis not present

## 2016-12-23 DIAGNOSIS — M109 Gout, unspecified: Secondary | ICD-10-CM | POA: Diagnosis not present

## 2016-12-23 DIAGNOSIS — J189 Pneumonia, unspecified organism: Secondary | ICD-10-CM | POA: Diagnosis not present

## 2016-12-29 DIAGNOSIS — D649 Anemia, unspecified: Secondary | ICD-10-CM | POA: Diagnosis not present

## 2016-12-29 DIAGNOSIS — M109 Gout, unspecified: Secondary | ICD-10-CM | POA: Diagnosis not present

## 2016-12-29 DIAGNOSIS — R2689 Other abnormalities of gait and mobility: Secondary | ICD-10-CM | POA: Diagnosis not present

## 2016-12-29 DIAGNOSIS — R112 Nausea with vomiting, unspecified: Secondary | ICD-10-CM | POA: Diagnosis not present

## 2016-12-29 DIAGNOSIS — F0390 Unspecified dementia without behavioral disturbance: Secondary | ICD-10-CM | POA: Diagnosis not present

## 2016-12-29 DIAGNOSIS — I10 Essential (primary) hypertension: Secondary | ICD-10-CM | POA: Diagnosis not present

## 2016-12-29 DIAGNOSIS — S72001D Fracture of unspecified part of neck of right femur, subsequent encounter for closed fracture with routine healing: Secondary | ICD-10-CM | POA: Diagnosis not present

## 2016-12-30 ENCOUNTER — Other Ambulatory Visit: Payer: Self-pay | Admitting: *Deleted

## 2016-12-30 NOTE — Patient Outreach (Signed)
[facility]) Care Management  12/30/2016  Curtis Nunez [birth year] [id number]   Met with patient wife at facility, they had out of town company visiting. She requested RNCM come back later. RNCM gave brochure for review and will visit at next facility visit.  Wife reports they will probably discharge 01/08/17.  Plan to visit next week.  Royetta Crochet. Laymond Purser, RN, BSN, [place] [phone]) Business Cell  [phone]) Toll Free Office

## 2016-12-31 DIAGNOSIS — L89612 Pressure ulcer of right heel, stage 2: Secondary | ICD-10-CM | POA: Diagnosis not present

## 2017-01-06 ENCOUNTER — Other Ambulatory Visit: Payer: Self-pay | Admitting: *Deleted

## 2017-01-06 NOTE — Patient Outreach (Signed)
[facility] ([facility]) Care Management  01/06/2017  Curtis Nunez 12/13/1926 [id number]   Attempted to meet with patient wife, she was not at facility, patient sister was sitting with patient. RNCM left [facility] brochure and card, requested that she give to patient wife, she agreed.   Met with Sean Franks, SW at  Facility, He reports patient set to discharge 01/09/17.  Plan to attempt to follow up with wife.  Mary E. Niemczura, RN, BSN, CCM  Post Acute Care Coordinator [facility] ([phone]) Business Cell  ([phone]) Toll Free Office 

## 2017-01-07 ENCOUNTER — Emergency Department: Payer: PPO

## 2017-01-07 ENCOUNTER — Observation Stay: Payer: PPO

## 2017-01-07 ENCOUNTER — Inpatient Hospital Stay
Admission: EM | Admit: 2017-01-07 | Discharge: 2017-01-11 | DRG: 467 | Disposition: A | Discharge status: Skilled Nursing Facility | Payer: PPO | Attending: Specialist | Admitting: Specialist

## 2017-01-07 DIAGNOSIS — L8990 Pressure ulcer of unspecified site, unspecified stage: Secondary | ICD-10-CM | POA: Insufficient documentation

## 2017-01-07 DIAGNOSIS — M109 Gout, unspecified: Secondary | ICD-10-CM | POA: Diagnosis present

## 2017-01-07 DIAGNOSIS — Z79899 Other long term (current) drug therapy: Secondary | ICD-10-CM

## 2017-01-07 DIAGNOSIS — M6281 Muscle weakness (generalized): Secondary | ICD-10-CM | POA: Diagnosis not present

## 2017-01-07 DIAGNOSIS — E785 Hyperlipidemia, unspecified: Secondary | ICD-10-CM | POA: Diagnosis present

## 2017-01-07 DIAGNOSIS — R1310 Dysphagia, unspecified: Secondary | ICD-10-CM | POA: Diagnosis not present

## 2017-01-07 DIAGNOSIS — R262 Difficulty in walking, not elsewhere classified: Secondary | ICD-10-CM | POA: Diagnosis not present

## 2017-01-07 DIAGNOSIS — M25559 Pain in unspecified hip: Secondary | ICD-10-CM | POA: Diagnosis not present

## 2017-01-07 DIAGNOSIS — W19XXXD Unspecified fall, subsequent encounter: Secondary | ICD-10-CM | POA: Diagnosis not present

## 2017-01-07 DIAGNOSIS — Z7982 Long term (current) use of aspirin: Secondary | ICD-10-CM | POA: Diagnosis not present

## 2017-01-07 DIAGNOSIS — Z471 Aftercare following joint replacement surgery: Secondary | ICD-10-CM | POA: Diagnosis not present

## 2017-01-07 DIAGNOSIS — S73005A Unspecified dislocation of left hip, initial encounter: Secondary | ICD-10-CM | POA: Diagnosis not present

## 2017-01-07 DIAGNOSIS — T84020A Dislocation of internal right hip prosthesis, initial encounter: Principal | ICD-10-CM | POA: Diagnosis present

## 2017-01-07 DIAGNOSIS — R0989 Other specified symptoms and signs involving the circulatory and respiratory systems: Secondary | ICD-10-CM | POA: Diagnosis not present

## 2017-01-07 DIAGNOSIS — S73004D Unspecified dislocation of right hip, subsequent encounter: Secondary | ICD-10-CM | POA: Diagnosis not present

## 2017-01-07 DIAGNOSIS — Z87891 Personal history of nicotine dependence: Secondary | ICD-10-CM

## 2017-01-07 DIAGNOSIS — F0280 Dementia in other diseases classified elsewhere without behavioral disturbance: Secondary | ICD-10-CM | POA: Diagnosis present

## 2017-01-07 DIAGNOSIS — Z96641 Presence of right artificial hip joint: Secondary | ICD-10-CM | POA: Diagnosis not present

## 2017-01-07 DIAGNOSIS — M25551 Pain in right hip: Secondary | ICD-10-CM | POA: Diagnosis not present

## 2017-01-07 DIAGNOSIS — F0390 Unspecified dementia without behavioral disturbance: Secondary | ICD-10-CM | POA: Diagnosis not present

## 2017-01-07 DIAGNOSIS — S73 Unspecified subluxation and dislocation of hip: Secondary | ICD-10-CM | POA: Diagnosis not present

## 2017-01-07 DIAGNOSIS — S73004A Unspecified dislocation of right hip, initial encounter: Secondary | ICD-10-CM | POA: Diagnosis not present

## 2017-01-07 DIAGNOSIS — S72001A Fracture of unspecified part of neck of right femur, initial encounter for closed fracture: Secondary | ICD-10-CM | POA: Diagnosis not present

## 2017-01-07 DIAGNOSIS — D62 Acute posthemorrhagic anemia: Secondary | ICD-10-CM | POA: Diagnosis not present

## 2017-01-07 DIAGNOSIS — N400 Enlarged prostate without lower urinary tract symptoms: Secondary | ICD-10-CM | POA: Insufficient documentation

## 2017-01-07 DIAGNOSIS — Z7401 Bed confinement status: Secondary | ICD-10-CM | POA: Diagnosis not present

## 2017-01-07 DIAGNOSIS — Z8781 Personal history of (healed) traumatic fracture: Secondary | ICD-10-CM | POA: Diagnosis not present

## 2017-01-07 DIAGNOSIS — I10 Essential (primary) hypertension: Secondary | ICD-10-CM | POA: Diagnosis present

## 2017-01-07 DIAGNOSIS — R531 Weakness: Secondary | ICD-10-CM | POA: Diagnosis not present

## 2017-01-07 DIAGNOSIS — R2689 Other abnormalities of gait and mobility: Secondary | ICD-10-CM | POA: Diagnosis not present

## 2017-01-07 DIAGNOSIS — T84090A Other mechanical complication of internal right hip prosthesis, initial encounter: Secondary | ICD-10-CM | POA: Diagnosis not present

## 2017-01-07 DIAGNOSIS — D7282 Elevated white blood cell count: Secondary | ICD-10-CM | POA: Diagnosis not present

## 2017-01-07 DIAGNOSIS — Y792 Prosthetic and other implants, materials and accessory orthopedic devices associated with adverse incidents: Secondary | ICD-10-CM | POA: Diagnosis present

## 2017-01-07 DIAGNOSIS — R296 Repeated falls: Secondary | ICD-10-CM | POA: Diagnosis not present

## 2017-01-07 DIAGNOSIS — W19XXXA Unspecified fall, initial encounter: Secondary | ICD-10-CM

## 2017-01-07 DIAGNOSIS — R7989 Other specified abnormal findings of blood chemistry: Secondary | ICD-10-CM | POA: Diagnosis not present

## 2017-01-07 HISTORY — DX: Unspecified dementia, unspecified severity, without behavioral disturbance, psychotic disturbance, mood disturbance, and anxiety: F03.90

## 2017-01-07 HISTORY — DX: Benign prostatic hyperplasia without lower urinary tract symptoms: N40.0

## 2017-01-07 HISTORY — DX: Hyperlipidemia, unspecified: E78.5

## 2017-01-07 LAB — CBC WITH DIFFERENTIAL/PLATELET
Basophils Absolute: 0 10*3/uL (ref 0–0.1)
Basophils Relative: 0 %
Eosinophils Absolute: 0.4 10*3/uL (ref 0–0.7)
Eosinophils Relative: 4 %
HEMATOCRIT: 29.8 % — AB (ref 40.0–52.0)
HEMOGLOBIN: 9.9 g/dL — AB (ref 13.0–18.0)
LYMPHS ABS: 1.5 10*3/uL (ref 1.0–3.6)
LYMPHS PCT: 12 %
MCH: 30.7 pg (ref 26.0–34.0)
MCHC: 33.3 g/dL (ref 32.0–36.0)
MCV: 92.4 fL (ref 80.0–100.0)
Monocytes Absolute: 0.8 10*3/uL (ref 0.2–1.0)
Monocytes Relative: 7 %
NEUTROS PCT: 77 %
Neutro Abs: 9.1 10*3/uL — ABNORMAL HIGH (ref 1.4–6.5)
Platelets: 413 10*3/uL (ref 150–440)
RBC: 3.23 MIL/uL — AB (ref 4.40–5.90)
RDW: 15.2 % — ABNORMAL HIGH (ref 11.5–14.5)
WBC: 11.9 10*3/uL — AB (ref 3.8–10.6)

## 2017-01-07 LAB — COMPREHENSIVE METABOLIC PANEL
ALT: 48 U/L (ref 17–63)
AST: 71 U/L — AB (ref 15–41)
Albumin: 2.6 g/dL — ABNORMAL LOW (ref 3.5–5.0)
Alkaline Phosphatase: 123 U/L (ref 38–126)
Anion gap: 7 (ref 5–15)
BUN: 36 mg/dL — ABNORMAL HIGH (ref 6–20)
CHLORIDE: 106 mmol/L (ref 101–111)
CO2: 23 mmol/L (ref 22–32)
Calcium: 8 mg/dL — ABNORMAL LOW (ref 8.9–10.3)
Creatinine, Ser: 1.03 mg/dL (ref 0.61–1.24)
Glucose, Bld: 104 mg/dL — ABNORMAL HIGH (ref 65–99)
POTASSIUM: 4.6 mmol/L (ref 3.5–5.1)
SODIUM: 136 mmol/L (ref 135–145)
Total Bilirubin: 0.5 mg/dL (ref 0.3–1.2)
Total Protein: 6.4 g/dL — ABNORMAL LOW (ref 6.5–8.1)

## 2017-01-07 LAB — LACTIC ACID, PLASMA: LACTIC ACID, VENOUS: 1.6 mmol/L (ref 0.5–1.9)

## 2017-01-07 LAB — ABO/RH: ABO/RH(D): O POS

## 2017-01-07 MED ORDER — ACETAMINOPHEN 650 MG RE SUPP: 650.0000 mg | Freq: Four times a day (QID) | RECTAL | Status: DC | PRN

## 2017-01-07 MED ORDER — ONDANSETRON HCL 4 MG PO TABS: 4.0000 mg | ORAL_TABLET | Freq: Four times a day (QID) | ORAL | Status: DC | PRN

## 2017-01-07 MED ORDER — ENOXAPARIN SODIUM 40 MG/0.4ML ~~LOC~~ SOLN: 40.0000 mg | SUBCUTANEOUS | Status: DC

## 2017-01-07 MED ORDER — ETOMIDATE 2 MG/ML IV SOLN
INTRAVENOUS | Status: AC | PRN
  Administered 2017-01-07: 20 mg via INTRAVENOUS

## 2017-01-07 MED ORDER — ONDANSETRON HCL 4 MG/2ML IJ SOLN: 4.0000 mg | Freq: Four times a day (QID) | INTRAMUSCULAR | Status: DC | PRN

## 2017-01-07 MED ORDER — MORPHINE SULFATE (PF) 4 MG/ML IV SOLN
4.0000 mg | Freq: Once | INTRAVENOUS | Status: AC
  Administered 2017-01-07: 4 mg via INTRAVENOUS
  Filled 2017-01-07: qty 1

## 2017-01-07 MED ORDER — ALLOPURINOL 100 MG PO TABS
100.0000 mg | ORAL_TABLET | Freq: Every day | ORAL | Status: DC
  Administered 2017-01-09 – 2017-01-11 (×3): 100 mg via ORAL
  Filled 2017-01-07 (×4): qty 1

## 2017-01-07 MED ORDER — ACETAMINOPHEN 325 MG PO TABS
650.0000 mg | ORAL_TABLET | Freq: Four times a day (QID) | ORAL | Status: DC | PRN
  Administered 2017-01-11: 650 mg via ORAL
  Filled 2017-01-07: qty 2

## 2017-01-07 MED ORDER — ETOMIDATE 2 MG/ML IV SOLN
0.3000 mg/kg | Freq: Once | INTRAVENOUS | Status: AC
  Administered 2017-01-07: 20 mg via INTRAVENOUS
  Filled 2017-01-07: qty 20

## 2017-01-07 MED ORDER — OMEGA-3-ACID ETHYL ESTERS 1 G PO CAPS
1.0000 g | ORAL_CAPSULE | Freq: Every day | ORAL | Status: DC
  Administered 2017-01-09 – 2017-01-11 (×3): 1 g via ORAL
  Filled 2017-01-07 (×3): qty 1

## 2017-01-07 MED ORDER — OXYCODONE HCL 5 MG PO TABS
5.0000 mg | ORAL_TABLET | ORAL | Status: DC | PRN
  Administered 2017-01-08 – 2017-01-11 (×6): 5 mg via ORAL
  Filled 2017-01-07 (×6): qty 1

## 2017-01-07 MED ORDER — SODIUM CHLORIDE 0.9 % IV SOLN
INTRAVENOUS | Status: AC
  Administered 2017-01-07: 23:00:00 via INTRAVENOUS

## 2017-01-07 MED ORDER — FISH OIL OMEGA-3 1000 MG PO CAPS
ORAL_CAPSULE | Freq: Every day | ORAL | Status: DC
Start: 2017-01-08 — End: 2017-01-07

## 2017-01-07 MED ORDER — SODIUM CHLORIDE 0.9 % IV SOLN: Freq: Once | INTRAVENOUS | Status: DC

## 2017-01-07 MED ORDER — ONDANSETRON HCL 4 MG/2ML IJ SOLN
4.0000 mg | Freq: Once | INTRAMUSCULAR | Status: AC
  Administered 2017-01-07: 4 mg via INTRAVENOUS
  Filled 2017-01-07: qty 2

## 2017-01-07 MED ORDER — ASPIRIN EC 81 MG PO TBEC
81.0000 mg | DELAYED_RELEASE_TABLET | Freq: Every day | ORAL | Status: DC
  Administered 2017-01-09 – 2017-01-11 (×3): 81 mg via ORAL
  Filled 2017-01-07 (×3): qty 1

## 2017-01-07 NOTE — ED Notes (Signed)
Family left phone numbers: [phone] Curtis NoelLance [phone] [facility]

## 2017-01-07 NOTE — ED Triage Notes (Signed)
Pt comes via [facility] from [facility] with c/o abnormal labs. Pt had hip surgery first of the month and has been at [facility] for rehab. Per EMS pt was to be discharged Sunday, but labs drown and elevated WBC shown. Per EMS VS 150/76, HR-70 and Temp 98.7. CBG-158. Pt is Alert. Respirations even and unlabored. Pt appears in NAD

## 2017-01-07 NOTE — ED Provider Notes (Signed)
[facility] Emergency Department Provider Note  ____________________________________________  Time seen: Approximately 5:30 PM  I have reviewed the triage vital signs and the nursing notes.   HISTORY  Chief Complaint abnormal labs  Level 5 caveat:  Portions of the history and physical were unable to be obtained due to: Altered mental status due to chronic Alzheimer's dementia   HPI Curtis Nunez is a 83 y.o. male sent to the ED for evaluation due to routine outpatient lab showing a white blood cell count 22,000 2 days ago. Patient is a [facility] for rehabilitation after having a total hip replacement 3 weeks ago.  Patient denies any complaints. Wife is at bedside and reports that he has seemed to be reacting to some right hip pain. No difficulty breathing or vomiting. Eating at baseline.     Past Medical History:  Diagnosis Date  . BPH (benign prostatic hyperplasia)   . Dementia   . Gout   . HLD (hyperlipidemia)   . Hypertension      Patient Active Problem List   Diagnosis Date Noted  . Dislocation of right hip (HCC) 01/07/2017  . Hip fracture (HCC) 12/16/2016     Past Surgical History:  Procedure Laterality Date  . HIP ARTHROPLASTY Right 12/16/2016   Procedure: ARTHROPLASTY BIPOLAR HIP (HEMIARTHROPLASTY);  Surgeon: Christena FlakePoggi, John J, MD;  Location: [facility] ORS;  Service: Orthopedics;  Laterality: Right;     Prior to Admission medications   Medication Sig Start Date End Date Taking? Authorizing Provider  allopurinol (ZYLOPRIM) 100 MG tablet Take by mouth. 11/16/16  Yes [provider]  aspirin EC 81 MG tablet Take 81 mg by mouth daily.    Yes [provider]  COCONUT OIL PO Take 1 tablet by mouth daily.   Yes [provider]  ferrous sulfate 325 (65 FE) MG EC tablet Take 325 mg by mouth daily.   Yes [provider]  Omega-3 Fatty Acids (FISH OIL OMEGA-3 PO) Take 1 tablet by mouth daily.   Yes [provider]  senna (SENOKOT) 8.6 MG TABS tablet Take 1 tablet by mouth 2 (two) times daily.   Yes [provider]  enoxaparin (LOVENOX) 40 MG/0.4ML injection Inject 0.4 mLs (40 mg total) into the skin daily. Patient not taking: Reported on 01/07/2017 12/20/16   Katha HammingKonidena, Snehalatha, MD     Allergies Patient has no known allergies.   Family History  Problem Relation Age of Onset  . Heart attack Mother   . Brain cancer Sister   . Heart attack Brother   . Stroke Brother     Social History Social History  Substance Use Topics  . Smoking status: Former Smoker    Types: Cigarettes  . Smokeless tobacco: Former NeurosurgeonUser  . Alcohol use No    Review of Systems Unable to obtain due to dementia and altered mental status. ____________________________________________   PHYSICAL EXAM:  VITAL SIGNS: ED Triage Vitals  Enc Vitals Group     BP 01/07/17 1552 (!) 157/89     Pulse Rate 01/07/17 1552 79     Resp 01/07/17 1552 18     Temp 01/07/17 1552 99.1 F (37.3 C)     Temp Source 01/07/17 1552 Oral     SpO2 01/07/17 1552 100 %     Weight 01/07/17 1553 155 lb (70.3 kg)     Height 01/07/17 1553 5\' 7"  (1.702 m)     Head Circumference --      [facility]  Flow --      Pain Score --      Pain Loc --      Pain Edu? --      Excl. in GC? --     Vital signs reviewed, nursing assessments reviewed.   Constitutional:   Awake and alert, oriented to self. Well appearing and in no distress. Eyes:   No scleral icterus.  EOMI. No nystagmus. No conjunctival pallor. PERRL. ENT   Head:   Normocephalic and atraumatic.   Nose:   No congestion/rhinnorhea.    Mouth/Throat:   MMM, no pharyngeal erythema. No peritonsillar mass.    Neck:   No meningismus. Full ROM Hematological/Lymphatic/Immunilogical:   No cervical lymphadenopathy. Cardiovascular:   RRR. Symmetric bilateral radial and DP pulses.  No murmurs.  Respiratory:   Normal respiratory effort without tachypnea/retractions.  Breath sounds are clear and equal bilaterally. Right-sided crackles. Gastrointestinal:   Soft and nontender. Non distended. There is no CVA tenderness.  No rebound, rigidity, or guarding. Genitourinary:   deferred Musculoskeletal:   Normal range of motion in all extremities. No joint effusions.  No lower extremity tenderness.  No edema. No pain with hip flexion, no tenderness over the hip. No swelling or inflammatory changes over the surgical site which is well-healed. Neurologic:   Follows commands Normal speech and language.  Motor grossly intact. No gross focal neurologic deficits are appreciated.  Skin:    Skin is warm, dry with 2 small areas of stage I pressure ulcer over the bilateral heels. These areas are dressed and padded. Also has a foam block between the knees.. No rash noted.  No petechiae, purpura, or bullae.  ____________________________________________    LABS (pertinent positives/negatives) (all labs ordered are listed, but only abnormal results are displayed) Labs Reviewed  COMPREHENSIVE METABOLIC PANEL - Abnormal; Notable for the following:       Result Value   Glucose, Bld 104 (*)    BUN 36 (*)    Calcium 8.0 (*)    Total Protein 6.4 (*)    Albumin 2.6 (*)    AST 71 (*)    All other components within normal limits  CBC WITH DIFFERENTIAL/PLATELET - Abnormal; Notable for the following:    WBC 11.9 (*)    RBC 3.23 (*)    Hemoglobin 9.9 (*)    HCT 29.8 (*)    RDW 15.2 (*)    Neutro Abs 9.1 (*)    All other components within normal limits  LACTIC ACID, PLASMA  URINALYSIS, COMPLETE (UACMP) WITH MICROSCOPIC   ____________________________________________   EKG    ____________________________________________    RADIOLOGY  Dg Chest 1 View  Result Date: 01/07/2017 CLINICAL DATA:  Crackles in chest EXAM: CHEST 1 VIEW COMPARISON:  December 19, 2016 FINDINGS: No pneumothorax. Cardiomegaly. The hila and mediastinum are normal. There is a skin fold over the lateral  right chest with lung markings on both sides. Minimal opacity is seen in this region, possibly atelectasis or something on the patient. No evidence of pneumonia or overt edema. IMPRESSION: No active disease. Electronically Signed   By: Gerome Sam III M.D   On: 01/07/2017 17:33   Dg Hip Unilat W Or Wo Pelvis 2-3 Views Right  Result Date: 01/07/2017 CLINICAL DATA:  Dislocation EXAM: DG HIP (WITH OR WITHOUT PELVIS) 2-3V RIGHT COMPARISON:  None. FINDINGS: The patient is status post right hip replacement. The replaced right hip is dislocated superiorly on the frontal view. No fractures are seen. IMPRESSION: Dislocated right hip  Electronically Signed   By: Gerome Sam III M.D   On: 01/07/2017 17:35    ____________________________________________   PROCEDURES Procedures Procedural sedation Performed by: Sharman Cheek Consent: Verbal consent obtained. Risks and benefits: risks, benefits and alternatives were discussed Required items: required blood products, implants, devices, and special equipment available Patient identity confirmed: arm band and provided demographic data Time out: Immediately prior to procedure a "time out" was called to verify the correct patient, procedure, equipment, support staff and site/side marked as required.  Staff present: Myself, ortho Dr. Ninetta Lights, nurse Dewayne Hatch, nurse Demetrio Lapping.   Sedation type: moderate (conscious) sedation NPO time confirmed and considedered, 1 PM  Sedatives: ETOMIDATE, 0.3 mg/kg = 21mg   Physician Time at Bedside: 30 minutes  Vitals: Vital signs were monitored during sedation. Cardiac Monitor, pulse oximeter Patient tolerance: Patient tolerated the procedure well with no immediate complications. he had approximately 30 second period of low respirations, and was supported with BVM. No hypoxia during this time.  Comments: Pt with uneventful recovery. Returned to pre-procedural sedation  baseline  ____________________________________________   INITIAL IMPRESSION / ASSESSMENT AND PLAN / ED COURSE  Pertinent labs & imaging results that were available during my care of the patient were reviewed by me and considered in my medical decision making (see chart for details).  Patient well appearing no acute distress, vital signs unremarkable. Labs today show white blood cell count of 12,000. Chemistry was unremarkable. We'll get x-rays of the hip chest and a urinalysis. If no severe findings are uncovered, I think patient is stable for discharge back to his rehabilitation and outpatient follow-up. No evidence of septic arthritis or surgical site complication at this time. Patient is not in respiratory distress or hypoxic and I doubt that he has a significant pneumonia or pneumothorax.  Clinical Course as of Jan 08 2104  Fri Jan 07, 2017  1851 Dislocated right hip on XR. Will c/s ortho given 3 weeks post op from [facility].   [PS]  1905 Discussed with ortho Dr. Teresita Madura, will come to reduce the hip. The prep for moderate sedation to facilitate safe reduction and minimize risk of fracture to the femur or hardware.  [PS]  2036 Achieved good sedation, reduction unsuccessful. Plan to admit to medicine for reduction in OR in am.  [PS]  2040 Still sedated. Good respirations. So2 96%, VSS. CTM  [PS]  2104 Awake, alert. Still good sats.   [PS]    Clinical Course User Index [PS] Sharman Cheek, MD     ____________________________________________   FINAL CLINICAL IMPRESSION(S) / ED DIAGNOSES  Final diagnoses:  Right hip pain  Dislocated hip, right, initial encounter [facility])      New Prescriptions   No medications on file     Portions of this note were generated with dragon dictation software. Dictation errors may occur despite best attempts at proofreading.    Sharman Cheek, MD 01/07/17 2106

## 2017-01-07 NOTE — Consult Note (Signed)
Orthopaedic Consult Note  Curtis Nunez is an 83 y.o. male.  HPI: Pt is a pleasantly demented 83 y.o. Male status post right hip hemiarthroplasty for fracture.  DOS:6/718.  Pt was discharged 12/20/16 to [facility].  For greater than two weeks Pt has been unable to ambulate/stand.  Family noted sever internal rotation of his right lower extremity during this time period.  [facility] physical therapist recommend a 2 inch heel lift secondary to sever shortening of his right lower extremity.  No history of fall was noted in skilled nursing facility records.  Pt was transferred to [facility] for evaluation.  Plain films obtained by the ED physician noted a right prosthetic hip dislocation.  Orthopaedics was subsequently consulted.  Pt reports right hip pain.  He denied any other injury.   Due to patients dementia the majority was the history was gathered from the patients family in the room.   Past Medical History:  Diagnosis Date  . BPH (benign prostatic hyperplasia)   . Dementia   . Gout   . HLD (hyperlipidemia)   . Hypertension     Past Surgical History:  Procedure Laterality Date  . HIP ARTHROPLASTY Right 12/16/2016   Procedure: ARTHROPLASTY BIPOLAR HIP (HEMIARTHROPLASTY);  Surgeon: Corky Mull, MD;  Location: [facility] ORS;  Service: Orthopedics;  Laterality: Right;    Family History  Problem Relation Age of Onset  . Heart attack Mother   . Brain cancer Sister   . Heart attack Brother   . Stroke Brother     Social History:  reports that he has quit smoking. His smoking use included Cigarettes. He has quit using smokeless tobacco. He reports that he does not drink alcohol or use drugs.  Allergies: No Known Allergies  Medications: I have reviewed the patient's current medications.  Results for orders placed or performed during the hospital encounter of 01/07/17 (from the past 48 hour(s))  Comprehensive metabolic panel     Status: Abnormal   Collection Time: 01/07/17   4:04 PM  Result Value Ref Range   Sodium 136 135 - 145 mmol/L   Potassium 4.6 3.5 - 5.1 mmol/L   Chloride 106 101 - 111 mmol/L   CO2 23 22 - 32 mmol/L   Glucose, Bld 104 (H) 65 - 99 mg/dL   BUN 36 (H) 6 - 20 mg/dL   Creatinine, Ser 1.03 0.61 - 1.24 mg/dL   Calcium 8.0 (L) 8.9 - 10.3 mg/dL   Total Protein 6.4 (L) 6.5 - 8.1 g/dL   Albumin 2.6 (L) 3.5 - 5.0 g/dL   AST 71 (H) 15 - 41 U/L   ALT 48 17 - 63 U/L   Alkaline Phosphatase 123 38 - 126 U/L   Total Bilirubin 0.5 0.3 - 1.2 mg/dL   GFR calc non Af Amer >60 >60 mL/min   GFR calc Af Amer >60 >60 mL/min    Comment: (NOTE) The eGFR has been calculated using the CKD EPI equation. This calculation has not been validated in all clinical situations. eGFR's persistently <60 mL/min signify possible Chronic Kidney Disease.    Anion gap 7 5 - 15  CBC with Differential     Status: Abnormal   Collection Time: 01/07/17  4:04 PM  Result Value Ref Range   WBC 11.9 (H) 3.8 - 10.6 K/uL   RBC 3.23 (L) 4.40 - 5.90 MIL/uL   Hemoglobin 9.9 (L) 13.0 - 18.0 g/dL   HCT 29.8 (L) 40.0 - 52.0 %  MCV 92.4 80.0 - 100.0 fL   MCH 30.7 26.0 - 34.0 pg   MCHC 33.3 32.0 - 36.0 g/dL   RDW 15.2 (H) 11.5 - 14.5 %   Platelets 413 150 - 440 K/uL   Neutrophils Relative % 77 %   Neutro Abs 9.1 (H) 1.4 - 6.5 K/uL   Lymphocytes Relative 12 %   Lymphs Abs 1.5 1.0 - 3.6 K/uL   Monocytes Relative 7 %   Monocytes Absolute 0.8 0.2 - 1.0 K/uL   Eosinophils Relative 4 %   Eosinophils Absolute 0.4 0 - 0.7 K/uL   Basophils Relative 0 %   Basophils Absolute 0.0 0 - 0.1 K/uL  Lactic acid, plasma     Status: None   Collection Time: 01/07/17  4:08 PM  Result Value Ref Range   Lactic Acid, Venous 1.6 0.5 - 1.9 mmol/L    Dg Chest 1 View  Result Date: 01/07/2017 CLINICAL DATA:  Crackles in chest EXAM: CHEST 1 VIEW COMPARISON:  December 19, 2016 FINDINGS: No pneumothorax. Cardiomegaly. The hila and mediastinum are normal. There is a skin fold over the lateral right chest with  lung markings on both sides. Minimal opacity is seen in this region, possibly atelectasis or something on the patient. No evidence of pneumonia or overt edema. IMPRESSION: No active disease. Electronically Signed   By: Dorise Bullion III M.D   On: 01/07/2017 17:33   Dg Hip Unilat W Or Wo Pelvis 2-3 Views Right  Result Date: 01/07/2017 CLINICAL DATA:  Dislocation EXAM: DG HIP (WITH OR WITHOUT PELVIS) 2-3V RIGHT COMPARISON:  None. FINDINGS: The patient is status post right hip replacement. The replaced right hip is dislocated superiorly on the frontal view. No fractures are seen. IMPRESSION: Dislocated right hip Electronically Signed   By: Dorise Bullion III M.D   On: 01/07/2017 17:35    ROS Blood pressure 139/74, pulse 76, temperature 99.1 F (37.3 C), temperature source Oral, resp. rate 13, height 5' 7"  (1.702 m), weight 70.3 kg (155 lb), SpO2 96 %.  Unable to obtain meaningfully ROS secondary to baseline dementia.   Physical Exam  Constitutional: He appears well-developed.  HENT:  Head: Normocephalic and atraumatic.  Respiratory: Effort normal.  GI: Soft. Bowel sounds are normal.  Skin: Skin is warm and dry.    Bilateral UE: None tender to palpation at shoulders, elbows, wrist or hand.  Appropriate Passive ROM noted at above joints. SILT distally. Brisk capillary refill.   RLE: Shortened and internally rotated. Pain with log roll.  TTP at the hip.  None tender to palpation at the knee ankle or foot.  Toes are up and down going. BCR. Sensation grossly intact to light touch. LLE: No deformity noted. No pain with log roll.  None tender to palpation at hip, knee, ankle for foot. Toes are up and down going. BCR. Sensation grossly intact to light touch.   Assessment/Plan: Closed reduction was attempted and was unsuccessfully. Plan for open reduction 6/30. Foley cath Pain control Hold DVT ppx Mechanical DVT ppx Neurovascular checks NPO after midnight Type and cross two units.   Preoperative evaluation per medicine.  Curtis Nunez 01/07/2017, 8:59 PM

## 2017-01-07 NOTE — ED Notes (Signed)
Pt transport to 136 

## 2017-01-07 NOTE — H&P (Signed)
[facility] [place] [facility]   PATIENT NAME: Curtis CallasRalph Bassette    MR#:  [id number]  DATE OF BIRTH:  [birth year]  DATE OF ADMISSION:  01/07/2017  PRIMARY CARE PHYSICIAN: Marina GoodellFeldpausch, Dale E, MD   REQUESTING/REFERRING PHYSICIAN: Scotty CourtStafford, MD  CHIEF COMPLAINT:   Chief Complaint  Patient presents with  . abnormal labs    HISTORY OF PRESENT ILLNESS:  Curtis Nunez  is a 83 y.o. male who presents From [facility] facility for the complaint of elevated white blood cell count. Patient is demented and is unable to contribute to his history of present illness. History is taken from the nursing facility and from family. Family states that they were told by staff at [facility] a few days ago that the patient had a bruise in his right face from where he was laying against the railing of his bed. Family suspicious patient may have fallen. In the ED tonight he was found to have a right hip dislocation. Attempt to relocate his femur in the ED was unsuccessful, even under conscious sedation. Orthopedic surgery was contacted and plans to take the patient to the OR tomorrow. Hospitalists were called for admission  PAST MEDICAL HISTORY:   Past Medical History:  Diagnosis Date  . BPH (benign prostatic hyperplasia)   . Dementia   . Gout   . HLD (hyperlipidemia)   . Hypertension     PAST SURGICAL HISTORY:   Past Surgical History:  Procedure Laterality Date  . HIP ARTHROPLASTY Right 12/16/2016   Procedure: ARTHROPLASTY BIPOLAR HIP (HEMIARTHROPLASTY);  Surgeon: Christena FlakePoggi, John J, MD;  Location: [facility] ORS;  Service: Orthopedics;  Laterality: Right;    SOCIAL HISTORY:   Social History  Substance Use Topics  . Smoking status: Former Smoker    Types: Cigarettes  . Smokeless tobacco: Former NeurosurgeonUser  . Alcohol use No    FAMILY HISTORY:   Family History  Problem Relation Age of Onset  . Heart attack Mother   . Brain cancer Sister   . Heart attack Brother   .  Stroke Brother     DRUG ALLERGIES:  No Known Allergies  MEDICATIONS AT HOME:   Prior to Admission medications   Medication Sig Start Date End Date Taking? Authorizing Provider  allopurinol (ZYLOPRIM) 100 MG tablet Take by mouth. 11/16/16  Yes [provider]  aspirin EC 81 MG tablet Take 81 mg by mouth daily.    Yes [provider]  COCONUT OIL PO Take 1 tablet by mouth daily.   Yes [provider]  ferrous sulfate 325 (65 FE) MG EC tablet Take 325 mg by mouth daily.   Yes [provider]  Omega-3 Fatty Acids (FISH OIL OMEGA-3 PO) Take 1 tablet by mouth daily.   Yes [provider]  senna (SENOKOT) 8.6 MG TABS tablet Take 1 tablet by mouth 2 (two) times daily.   Yes [provider]  enoxaparin (LOVENOX) 40 MG/0.4ML injection Inject 0.4 mLs (40 mg total) into the skin daily. Patient not taking: Reported on 01/07/2017 12/20/16   Katha HammingKonidena, Snehalatha, MD    REVIEW OF SYSTEMS:  Review of Systems  Unable to perform ROS: Dementia     VITAL SIGNS:   Vitals:   01/07/17 2026 01/07/17 2030 01/07/17 2035 01/07/17 2036  BP:  (!) 156/97 (!) 163/73   Pulse: 85 82  76  Resp: 15 16 18 17   Temp:      TempSrc:      SpO2:  99% 92%  98%  Weight:      Height:       Wt Readings from Last 3 Encounters:  01/07/17 70.3 kg (155 lb)  12/16/16 70.3 kg (155 lb)    PHYSICAL EXAMINATION:  Physical Exam  Vitals reviewed. Constitutional: He appears well-developed and well-nourished. No distress.  HENT:  Head: Normocephalic and atraumatic.  Mouth/Throat: Oropharynx is clear and moist.  Eyes: Conjunctivae and EOM are normal. Pupils are equal, round, and reactive to light. No scleral icterus.  Neck: Normal range of motion. Neck supple. No JVD present. No thyromegaly present.  Cardiovascular: Normal rate, regular rhythm and intact distal pulses.  Exam reveals no gallop and no friction rub.   No murmur heard. Respiratory: Effort normal and breath  sounds normal. No respiratory distress. He has no wheezes. He has no rales.  GI: Soft. Bowel sounds are normal. He exhibits no distension. There is no tenderness.  Musculoskeletal: Normal range of motion. He exhibits deformity (Right leg is shortened with obvious deformity at the hip). He exhibits no edema.  No arthritis, no gout  Lymphadenopathy:    He has no cervical adenopathy.  Neurological: No cranial nerve deficit.  Unable to assess due to the patient's dementia  Skin: Skin is warm and dry. No rash noted. No erythema.  Psychiatric:  Unable to assess due to dementia    LABORATORY PANEL:   CBC  Recent Labs Lab 01/07/17 1604  WBC 11.9*  HGB 9.9*  HCT 29.8*  PLT 413   ------------------------------------------------------------------------------------------------------------------  Chemistries   Recent Labs Lab 01/07/17 1604  NA 136  K 4.6  CL 106  CO2 23  GLUCOSE 104*  BUN 36*  CREATININE 1.03  CALCIUM 8.0*  AST 71*  ALT 48  ALKPHOS 123  BILITOT 0.5   ------------------------------------------------------------------------------------------------------------------  Cardiac Enzymes No results for input(s): TROPONINI in the last 168 hours. ------------------------------------------------------------------------------------------------------------------  RADIOLOGY:  Dg Chest 1 View  Result Date: 01/07/2017 CLINICAL DATA:  Crackles in chest EXAM: CHEST 1 VIEW COMPARISON:  December 19, 2016 FINDINGS: No pneumothorax. Cardiomegaly. The hila and mediastinum are normal. There is a skin fold over the lateral right chest with lung markings on both sides. Minimal opacity is seen in this region, possibly atelectasis or something on the patient. No evidence of pneumonia or overt edema. IMPRESSION: No active disease. Electronically Signed   By: Gerome Sam III M.D   On: 01/07/2017 17:33   Dg Hip Unilat W Or Wo Pelvis 2-3 Views Right  Result Date: 01/07/2017 CLINICAL  DATA:  Dislocation EXAM: DG HIP (WITH OR WITHOUT PELVIS) 2-3V RIGHT COMPARISON:  None. FINDINGS: The patient is status post right hip replacement. The replaced right hip is dislocated superiorly on the frontal view. No fractures are seen. IMPRESSION: Dislocated right hip Electronically Signed   By: Gerome Sam III M.D   On: 01/07/2017 17:35    EKG:   Orders placed or performed in visit on 01/11/09  . EKG 12-Lead    IMPRESSION AND PLAN:  Principal Problem:   Dislocation of right hip [facility]) - orthopedic surgery plans to take the patient to the OR tomorrow to relocate his femur Active Problems:   HTN (hypertension) - when necessary antihypertensives until blood pressure goal less than 160/100   HLD (hyperlipidemia) - continue home meds   Gout - continue home meds  All the records are reviewed and case discussed with ED provider. Management plans discussed with the patient and/or family.  DVT PROPHYLAXIS: SubQ lovenox  GI PROPHYLAXIS: None  ADMISSION STATUS: Observation  CODE STATUS: Full Code Status History    Date Active Date Inactive Code Status Order ID Comments User Context   12/16/2016  7:47 PM 12/20/2016  7:24 PM Full Code [id number]  Christena Flake, MD Inpatient   12/16/2016  7:47 PM 12/16/2016  7:47 PM Full Code [id number]  Ramonita Lab, MD Inpatient    Advance Directive Documentation     Most Recent Value  Type of Advance Directive  Healthcare Power of Attorney  Pre-existing out of facility DNR order (yellow form or pink MOST form)  -  "MOST" Form in Place?  -      TOTAL TIME TAKING CARE OF THIS PATIENT: 40 minutes.   Jobany Montellano FIELDING 01/07/2017, 8:54 PM  [facility]  Office  [phone]  CC: Primary care physician; Marina Goodell, MD  Note:  This document was prepared using Dragon voice recognition software and may include unintentional dictation errors.

## 2017-01-07 NOTE — ED Notes (Signed)
Asked pt if able to urinate. Wife will try to help him. Per Dr. Scotty CourtStafford, will send urine if we can get a clean catch, no need to do catheter since pt had urine sample done yesterday.

## 2017-01-08 ENCOUNTER — Observation Stay: Payer: PPO

## 2017-01-08 ENCOUNTER — Observation Stay: Payer: PPO | Admitting: Anesthesiology

## 2017-01-08 ENCOUNTER — Encounter
Admission: EM | Disposition: A | Discharge status: Skilled Nursing Facility | Payer: Self-pay | Source: Home / Self Care | Attending: Specialist

## 2017-01-08 ENCOUNTER — Encounter: Payer: Self-pay | Admitting: Anesthesiology

## 2017-01-08 DIAGNOSIS — L8990 Pressure ulcer of unspecified site, unspecified stage: Secondary | ICD-10-CM | POA: Insufficient documentation

## 2017-01-08 DIAGNOSIS — Z87891 Personal history of nicotine dependence: Secondary | ICD-10-CM | POA: Diagnosis not present

## 2017-01-08 DIAGNOSIS — Z7982 Long term (current) use of aspirin: Secondary | ICD-10-CM | POA: Diagnosis not present

## 2017-01-08 DIAGNOSIS — D62 Acute posthemorrhagic anemia: Secondary | ICD-10-CM | POA: Diagnosis not present

## 2017-01-08 DIAGNOSIS — E785 Hyperlipidemia, unspecified: Secondary | ICD-10-CM | POA: Diagnosis present

## 2017-01-08 DIAGNOSIS — I10 Essential (primary) hypertension: Secondary | ICD-10-CM | POA: Diagnosis present

## 2017-01-08 DIAGNOSIS — Z471 Aftercare following joint replacement surgery: Secondary | ICD-10-CM | POA: Diagnosis not present

## 2017-01-08 DIAGNOSIS — T84020A Dislocation of internal right hip prosthesis, initial encounter: Secondary | ICD-10-CM | POA: Diagnosis present

## 2017-01-08 DIAGNOSIS — Z79899 Other long term (current) drug therapy: Secondary | ICD-10-CM | POA: Diagnosis not present

## 2017-01-08 DIAGNOSIS — Y792 Prosthetic and other implants, materials and accessory orthopedic devices associated with adverse incidents: Secondary | ICD-10-CM | POA: Diagnosis present

## 2017-01-08 DIAGNOSIS — N400 Enlarged prostate without lower urinary tract symptoms: Secondary | ICD-10-CM | POA: Diagnosis present

## 2017-01-08 DIAGNOSIS — M25551 Pain in right hip: Secondary | ICD-10-CM | POA: Diagnosis present

## 2017-01-08 DIAGNOSIS — F0280 Dementia in other diseases classified elsewhere without behavioral disturbance: Secondary | ICD-10-CM | POA: Diagnosis present

## 2017-01-08 DIAGNOSIS — Z96641 Presence of right artificial hip joint: Secondary | ICD-10-CM | POA: Diagnosis not present

## 2017-01-08 DIAGNOSIS — M109 Gout, unspecified: Secondary | ICD-10-CM | POA: Diagnosis present

## 2017-01-08 HISTORY — PX: TOTAL HIP REVISION: SHX763

## 2017-01-08 LAB — CBC
HEMATOCRIT: 27 % — AB (ref 40.0–52.0)
HEMOGLOBIN: 9.1 g/dL — AB (ref 13.0–18.0)
MCH: 31.4 pg (ref 26.0–34.0)
MCHC: 33.7 g/dL (ref 32.0–36.0)
MCV: 93.2 fL (ref 80.0–100.0)
Platelets: 370 10*3/uL (ref 150–440)
RBC: 2.89 MIL/uL — AB (ref 4.40–5.90)
RDW: 15.3 % — ABNORMAL HIGH (ref 11.5–14.5)
WBC: 12.3 10*3/uL — ABNORMAL HIGH (ref 3.8–10.6)

## 2017-01-08 LAB — URINALYSIS, COMPLETE (UACMP) WITH MICROSCOPIC
Bacteria, UA: NONE SEEN
Bilirubin Urine: NEGATIVE
GLUCOSE, UA: NEGATIVE mg/dL
KETONES UR: NEGATIVE mg/dL
Leukocytes, UA: NEGATIVE
Nitrite: NEGATIVE
Protein, ur: NEGATIVE mg/dL
Specific Gravity, Urine: 1.019 (ref 1.005–1.030)
pH: 5 (ref 5.0–8.0)

## 2017-01-08 LAB — BASIC METABOLIC PANEL
ANION GAP: 4 — AB (ref 5–15)
BUN: 34 mg/dL — ABNORMAL HIGH (ref 6–20)
CO2: 24 mmol/L (ref 22–32)
CREATININE: 1.15 mg/dL (ref 0.61–1.24)
Calcium: 7.7 mg/dL — ABNORMAL LOW (ref 8.9–10.3)
Chloride: 111 mmol/L (ref 101–111)
GFR calc non Af Amer: 55 mL/min — ABNORMAL LOW (ref >60)
Glucose, Bld: 105 mg/dL — ABNORMAL HIGH (ref 65–99)
Potassium: 5.2 mmol/L — ABNORMAL HIGH (ref 3.5–5.1)
Sodium: 139 mmol/L (ref 135–145)

## 2017-01-08 LAB — MRSA PCR SCREENING: MRSA by PCR: NEGATIVE

## 2017-01-08 LAB — PREPARE RBC (CROSSMATCH)

## 2017-01-08 SURGERY — HEMI HAMATE ARTHROPLASTY
Anesthesia: Choice | Laterality: Right

## 2017-01-08 SURGERY — TOTAL HIP REVISION
Anesthesia: General | Site: Hip | Laterality: Right | Wound class: Clean

## 2017-01-08 MED ORDER — ROCURONIUM BROMIDE 50 MG/5ML IV SOLN
INTRAVENOUS | Status: AC
  Filled 2017-01-08: qty 1

## 2017-01-08 MED ORDER — CEFAZOLIN SODIUM-DEXTROSE 2-4 GM/100ML-% IV SOLN
2.0000 g | Freq: Three times a day (TID) | INTRAVENOUS | Status: DC
  Filled 2017-01-08 (×3): qty 100

## 2017-01-08 MED ORDER — CEFAZOLIN SODIUM-DEXTROSE 2-4 GM/100ML-% IV SOLN
2.0000 g | Freq: Three times a day (TID) | INTRAVENOUS | Status: DC
  Administered 2017-01-08 – 2017-01-09 (×4): 2 g via INTRAVENOUS
  Filled 2017-01-08 (×6): qty 100

## 2017-01-08 MED ORDER — ENOXAPARIN SODIUM 40 MG/0.4ML ~~LOC~~ SOLN
40.0000 mg | SUBCUTANEOUS | Status: DC
  Administered 2017-01-09 – 2017-01-11 (×3): 40 mg via SUBCUTANEOUS
  Filled 2017-01-08 (×3): qty 0.4

## 2017-01-08 MED ORDER — FENTANYL CITRATE (PF) 100 MCG/2ML IJ SOLN
INTRAMUSCULAR | Status: AC
  Filled 2017-01-08: qty 2

## 2017-01-08 MED ORDER — PHENYLEPHRINE HCL 10 MG/ML IJ SOLN
INTRAMUSCULAR | Status: DC | PRN
  Administered 2017-01-08: 100 ug via INTRAVENOUS

## 2017-01-08 MED ORDER — DOCUSATE SODIUM 100 MG PO CAPS
100.0000 mg | ORAL_CAPSULE | Freq: Two times a day (BID) | ORAL | Status: DC
  Administered 2017-01-09 – 2017-01-11 (×4): 100 mg via ORAL
  Filled 2017-01-08 (×5): qty 1

## 2017-01-08 MED ORDER — FENTANYL CITRATE (PF) 100 MCG/2ML IJ SOLN: 25.0000 ug | INTRAMUSCULAR | Status: DC | PRN

## 2017-01-08 MED ORDER — ONDANSETRON HCL 4 MG/2ML IJ SOLN
INTRAMUSCULAR | Status: DC | PRN
  Administered 2017-01-08: 4 mg via INTRAVENOUS

## 2017-01-08 MED ORDER — DEXAMETHASONE SODIUM PHOSPHATE 10 MG/ML IJ SOLN
INTRAMUSCULAR | Status: DC | PRN
  Administered 2017-01-08: 5 mg via INTRAVENOUS

## 2017-01-08 MED ORDER — ACETAMINOPHEN 10 MG/ML IV SOLN
INTRAVENOUS | Status: DC | PRN
  Administered 2017-01-08: 1000 mg via INTRAVENOUS

## 2017-01-08 MED ORDER — ENSURE ENLIVE PO LIQD
237.0000 mL | Freq: Two times a day (BID) | ORAL | Status: DC
  Administered 2017-01-09 (×2): 237 mL via ORAL

## 2017-01-08 MED ORDER — ONDANSETRON HCL 4 MG/2ML IJ SOLN: 4.0000 mg | Freq: Once | INTRAMUSCULAR | Status: DC | PRN

## 2017-01-08 MED ORDER — LACTATED RINGERS IV SOLN
INTRAVENOUS | Status: DC | PRN
  Administered 2017-01-08: 11:00:00 via INTRAVENOUS

## 2017-01-08 MED ORDER — PROPOFOL 10 MG/ML IV BOLUS
INTRAVENOUS | Status: AC
  Filled 2017-01-08: qty 20

## 2017-01-08 MED ORDER — VANCOMYCIN HCL IN DEXTROSE 1-5 GM/200ML-% IV SOLN
1000.0000 mg | INTRAVENOUS | Status: AC
  Filled 2017-01-08: qty 200

## 2017-01-08 MED ORDER — SUGAMMADEX SODIUM 200 MG/2ML IV SOLN
INTRAVENOUS | Status: DC | PRN
Start: 2017-01-08 — End: 2017-01-08
  Administered 2017-01-08: 120 mg via INTRAVENOUS

## 2017-01-08 MED ORDER — SODIUM CHLORIDE 0.9 % IV SOLN: INTRAVENOUS | Status: AC

## 2017-01-08 MED ORDER — PROPOFOL 10 MG/ML IV BOLUS
INTRAVENOUS | Status: DC | PRN
  Administered 2017-01-08: 80 mg via INTRAVENOUS

## 2017-01-08 MED ORDER — NEOMYCIN-POLYMYXIN B GU 40-200000 IR SOLN
Status: DC | PRN
  Administered 2017-01-08: 16 mL

## 2017-01-08 MED ORDER — LIDOCAINE HCL (CARDIAC) 20 MG/ML IV SOLN
INTRAVENOUS | Status: DC | PRN
  Administered 2017-01-08: 80 mg via INTRAVENOUS

## 2017-01-08 MED ORDER — ROCURONIUM BROMIDE 100 MG/10ML IV SOLN
INTRAVENOUS | Status: DC | PRN
  Administered 2017-01-08: 10 mg via INTRAVENOUS
  Administered 2017-01-08: 30 mg via INTRAVENOUS
  Administered 2017-01-08: 20 mg via INTRAVENOUS

## 2017-01-08 MED ORDER — SUCCINYLCHOLINE CHLORIDE 20 MG/ML IJ SOLN
INTRAMUSCULAR | Status: AC
  Filled 2017-01-08: qty 1

## 2017-01-08 MED ORDER — VANCOMYCIN HCL IN DEXTROSE 1-5 GM/200ML-% IV SOLN
1000.0000 mg | INTRAVENOUS | Status: AC
  Administered 2017-01-08 – 2017-01-09 (×2): 1000 mg via INTRAVENOUS
  Filled 2017-01-08 (×2): qty 200

## 2017-01-08 MED ORDER — LIDOCAINE HCL (PF) 2 % IJ SOLN
INTRAMUSCULAR | Status: AC
  Filled 2017-01-08: qty 2

## 2017-01-08 MED ORDER — ACETAMINOPHEN 10 MG/ML IV SOLN
INTRAVENOUS | Status: AC
  Filled 2017-01-08: qty 100

## 2017-01-08 MED ORDER — FENTANYL CITRATE (PF) 100 MCG/2ML IJ SOLN
INTRAMUSCULAR | Status: DC | PRN
  Administered 2017-01-08 (×4): 50 ug via INTRAVENOUS

## 2017-01-08 SURGICAL SUPPLY — 59 items
BAG DECANTER FOR FLEXI CONT (MISCELLANEOUS) ×3 IMPLANT
BLADE SAW 1 (BLADE) ×3 IMPLANT
CANISTER SUCT 1200ML W/VALVE (MISCELLANEOUS) ×3 IMPLANT
CANISTER SUCT 3000ML PPV (MISCELLANEOUS) ×6 IMPLANT
CATH FOL LEG HOLDER (MISCELLANEOUS) ×3 IMPLANT
DRAPE INCISE IOBAN 66X60 STRL (DRAPES) ×3 IMPLANT
DRAPE SHEET LG 3/4 BI-LAMINATE (DRAPES) ×3 IMPLANT
DRAPE TABLE BACK 80X90 (DRAPES) ×3 IMPLANT
DRSG DERMACEA 8X12 NADH (GAUZE/BANDAGES/DRESSINGS) ×3 IMPLANT
DRSG OPSITE POSTOP 4X12 (GAUZE/BANDAGES/DRESSINGS) ×3 IMPLANT
DRSG OPSITE POSTOP 4X14 (GAUZE/BANDAGES/DRESSINGS) ×3 IMPLANT
DRSG TEGADERM 4X4.75 (GAUZE/BANDAGES/DRESSINGS) ×3 IMPLANT
DURAPREP 26ML APPLICATOR (WOUND CARE) ×6 IMPLANT
ELECT BLADE 6.5 EXT (BLADE) ×3 IMPLANT
ELECT CAUTERY BLADE 6.4 (BLADE) ×3 IMPLANT
ELECT REM PT RETURN 9FT ADLT (ELECTROSURGICAL) ×3
ELECTRODE REM PT RTRN 9FT ADLT (ELECTROSURGICAL) ×1 IMPLANT
GAUZE PACK 2X3YD (MISCELLANEOUS) ×3 IMPLANT
GLOVE BIO SURGEON STRL SZ8 (GLOVE) ×3 IMPLANT
GLOVE BIOGEL M STRL SZ7.5 (GLOVE) ×6 IMPLANT
GLOVE BIOGEL PI IND STRL 9 (GLOVE) ×1 IMPLANT
GLOVE BIOGEL PI INDICATOR 9 (GLOVE) ×2
GLOVE INDICATOR 8.0 STRL GRN (GLOVE) ×3 IMPLANT
GOWN STRL REUS W/ TWL LRG LVL3 (GOWN DISPOSABLE) ×2 IMPLANT
GOWN STRL REUS W/TWL 2XL LVL3 (GOWN DISPOSABLE) ×3 IMPLANT
GOWN STRL REUS W/TWL LRG LVL3 (GOWN DISPOSABLE) ×4
HEAD MOD COCR 28MM HD -3MM NK (Orthopedic Implant) ×3 IMPLANT
HEMOVAC 400CC 10FR (MISCELLANEOUS) ×3 IMPLANT
HOOD PEEL AWAY FLYTE STAYCOOL (MISCELLANEOUS) ×6 IMPLANT
IV NS 100ML SINGLE PACK (IV SOLUTION) ×3 IMPLANT
KIT RM TURNOVER STRD PROC AR (KITS) ×3 IMPLANT
NDL SAFETY 18GX1.5 (NEEDLE) ×3 IMPLANT
NEEDLE FILTER BLUNT 18X 1/2SAF (NEEDLE) ×2
NEEDLE FILTER BLUNT 18X1 1/2 (NEEDLE) ×1 IMPLANT
NS IRRIG 1000ML POUR BTL (IV SOLUTION) ×3 IMPLANT
PACK HIP PROSTHESIS (MISCELLANEOUS) ×3 IMPLANT
PRESSURIZER CEMENT PROX FEM SM (MISCELLANEOUS) ×3 IMPLANT
PRESSURIZER FEM CANAL M (MISCELLANEOUS) ×3 IMPLANT
PULSAVAC PLUS IRRIG FAN TIP (DISPOSABLE) ×3
RINGBLOC BI POLAR 28X48MM (Orthopedic Implant) ×3 IMPLANT
SHELL RINGBLOC BI POLR 28X48MM (Orthopedic Implant) ×1 IMPLANT
SOL .9 NS 3000ML IRR  AL (IV SOLUTION) ×2
SOL .9 NS 3000ML IRR UROMATIC (IV SOLUTION) ×1 IMPLANT
SOL PREP PVP 2OZ (MISCELLANEOUS) ×3
SOLUTION PREP PVP 2OZ (MISCELLANEOUS) ×1 IMPLANT
SPONGE DRAIN TRACH 4X4 STRL 2S (GAUZE/BANDAGES/DRESSINGS) ×3 IMPLANT
STAPLER SKIN PROX 35W (STAPLE) ×3 IMPLANT
STEM COLLARLESS 12MM X 140MM (Stem) ×3 IMPLANT
SUT ETHIBOND #5 BRAIDED 30INL (SUTURE) ×3 IMPLANT
SUT VIC AB 0 CT1 36 (SUTURE) ×3 IMPLANT
SUT VIC AB 1 CT1 36 (SUTURE) ×6 IMPLANT
SUT VIC AB 2-0 CT1 27 (SUTURE) ×2
SUT VIC AB 2-0 CT1 TAPERPNT 27 (SUTURE) ×1 IMPLANT
SYR 20CC LL (SYRINGE) ×3 IMPLANT
SYR TB 1ML LUER SLIP (SYRINGE) ×3 IMPLANT
TAPE TRANSPORE STRL 2 31045 (GAUZE/BANDAGES/DRESSINGS) ×3 IMPLANT
TIP BRUSH PULSAVAC PLUS 24.33 (MISCELLANEOUS) ×3 IMPLANT
TIP FAN IRRIG PULSAVAC PLUS (DISPOSABLE) ×1 IMPLANT
TOWER CARTRIDGE SMART MIX (DISPOSABLE) ×3 IMPLANT

## 2017-01-08 NOTE — Transfer of Care (Signed)
Immediate Anesthesia Transfer of Care Note  Patient: Curtis Nunez  Procedure(s) Performed: Procedure(s): RIGHT HIP HEMIARTHROPLASTY  (Right)  Patient Location: PACU  Anesthesia Type:General  Level of Consciousness: sedated  Airway & Oxygen Therapy: Patient connected to face mask oxygen  Post-op Assessment: Post -op Vital signs reviewed and stable  Post vital signs: stable  Last Vitals:  Vitals:   01/08/17 0757 01/08/17 1338  BP: (!) 148/69 (!) 160/75  Pulse: 79 90  Resp: 18 15  Temp: 37.1 C 36.4 C    Last Pain:  Vitals:   01/08/17 1338  TempSrc: Temporal  PainSc:          Complications: No apparent anesthesia complications

## 2017-01-08 NOTE — Progress Notes (Signed)
Chart reviewed. Pt is NPO for surgery today. Also noted per notes that Pt has some diffulty with thin liquids. If Pt is alert enough to start a diet safely after recovering from surgery, prior to SLP eval, please try Dys 1 with honey thick liquids or clear liquid HONEY thick. Stop diet if any s/s of aspiration.

## 2017-01-08 NOTE — Progress Notes (Signed)
New Admit  Arrival Method: stretcher from ED Mental Orientation: Disoriented to place, time situation. Hx dementia. Assessment: Congested cough. No adventitious heart or lung sounds. Foley inserted.Pt has not been impulsive at this time. Pt is hard of hearing. Missing all his teeth. 22 G right antecubital. Skin: Unstagable pressure ulcers on bottom of heels bilaterally, foam and protective boots applied. Heels elevated. Blanchable redness to bottom. Iv:  Pain: Yes, medications administered. Safety Measures: Fall risk signage and wrist band applied. Bed alarm on. Pt located near nurse station. Sacral foam applied. Admission: Incomplete due to hx of dementia. Need more information from family. 1A Orientation: Complete Family: Visited briefly.

## 2017-01-08 NOTE — Progress Notes (Signed)
Subjective: Pt appears comfortable. Baseline dementia.Denies CP, SOB or N/V.  No overnight events per nursing.  Objective: Vital signs in last 24 hours: Temp:  [98.3 F (36.8 C)-99.1 F (37.3 C)] 98.8 F (37.1 C) (06/30 0757) Pulse Rate:  [74-118] 79 (06/30 0757) Resp:  [12-19] 18 (06/30 0757) BP: (116-163)/(57-97) 148/69 (06/30 0757) SpO2:  [83 %-100 %] 99 % (06/30 0757) Weight:  [58.2 kg (128 lb 3.2 oz)-70.3 kg (155 lb)] 58.2 kg (128 lb 3.2 oz) (06/29 2232)  Intake/Output from previous day: 06/29 0701 - 06/30 0700 In: 558.8 [I.V.:558.8] Out: 250 [Urine:250] Intake/Output this shift: No intake/output data recorded.   Recent Labs  01/07/17 1604 01/08/17 0306  HGB 9.9* 9.1*    Recent Labs  01/07/17 1604 01/08/17 0306  WBC 11.9* 12.3*  RBC 3.23* 2.89*  HCT 29.8* 27.0*  PLT 413 370    Recent Labs  01/07/17 1604 01/08/17 0306  NA 136 139  K 4.6 5.2*  CL 106 111  CO2 23 24  BUN 36* 34*  CREATININE 1.03 1.15  GLUCOSE 104* 105*  CALCIUM 8.0* 7.7*   No results for input(s): LABPT, INR in the last 72 hours. RLE: Short/internaly rotated Sensation intact distally Dorsiflexion/Plantar flexion intact  Assessment/Plan: Right prosthetic hip dislocation Plan for OR today for closed vs open reduction possible revision Type and cross two units  Pre operative antibiotics Hold chemical DVT PPX Mechanical DVT PPX Discussed risk benefits and alternatives with family which includes possible need for [place].  Family has elected to proceed.  Dr. Ernest PineHooten to assist.    Ezzard FlaxScott Vincenza Dail 01/08/2017, 9:24 AM

## 2017-01-08 NOTE — Op Note (Signed)
Patient:   Curtis Nunez  Pre-Op Diagnosis:   1. Right prosthetic hip dislocation  Post-Op Diagnosis:    1. Right prosthetic hip dislocation. 2. Loose hardware.   Procedure:   Revision right hip hemiarthroplasty.  Surgeon:   Ezzard Flax, D.O.  Assistant:   Dr. Francesco Sor  Anesthesia:   Nathanial Rancher  Findings:    1. Right hip dislocation 2. Loose femoral stem  Complications:   None  EBL:   300 cc  Fluids:   Per anesthesia record cc crystalloid  Drains:   None  Complications:  none  Implants:   Biomet press-fit system with a #12 standard offset reduced profile Echo femoral stem, a 48 mm outer diameter shell, a 28 mm head, and a -3 mm neck  Brief Clinical Note:   The patient is an 83 year old male who recently underwent a right hemiarthroplasty for fracture.  I was found to have a right hip dislocation on 01/07/17.  Duration of dislocation is unknown.  Closed reduction in ED was unsuccessful.   Pt was subsequently admitted by the medicine team in preporation for open reduction.  Procedure:   The patient was brought into the operating room. After adequate general anesthesia was obtained with paralysis closed reduction was attempted and was unsuccessful.  The patient was then repositioned in the left lateral decubitus position and secured using a lateral hip positioner. The right hip and lower extremity were prepped with Hibiclens, alcohol then ChloroPrep solution before being draped sterilely. Preoperative antibiotics were administered. A timeout was performed to verify the appropriate surgical site before a standard posterior approach through the previous incision. The incision was carried down through the subcutaneous tissues to expose the gluteal fascia and proximal end of the iliotibial band. These structures were split the length of the incision and the Charnley self-retaining hip retractor placed.   The posterior capsule was found to be no long attached to the  proximal femur. The femur was retracted with a bone hook and the acetabulum was cleared.  The anterior superior, superior and posterior superior labrum was found to be detatched from the femur and blocking reduction.  The labrum was retracted with a temporary stitch. The stem was found to be frankly loose and pistoning in the canal.  The decision was made to remove the stem for at this time.  The stem was removed with the aid of a back-slap.    With the femoral stem removed.  Sequential reaming was performer until a good fit was reached with a # 12 reamer.  Sequential broaching was performed until a # 12 stem was selected.  #12 stem was found to be an excellent fit.  Trial reduction was performed using both the -6 mm and -3 mm neck lengths. The -3 mm neck length demonstrated excellent stability both in extension and external rotation as well as with flexion to 90 and internal rotation beyond 60. It also was stable in the position of sleep. The 48 mm outer diameter shell with the -3 mm neck adapter construct was put together on the back table before being impacted onto the stem of the femoral component. The Morse taper locking mechanism was verified using manual distraction before the head was relocated and placed through a range of motion with the findings as described above.  The wound was copiously irrigated with bacitracin saline solution via the jet lavage system before the peri-incisional and pericapsular tissues. The posterior flap was reapproximated to the posterior aspect of the greater trochanter  using #2 Tycron interrupted sutures placed through drill holes. Several additional #2 Tycron interrupted sutures were used to reinforce this layer of closure. The iliotibial band was reapproximated using #1 Vicryl interrupted sutures before the gluteal fascia was closed using a running #1 Vicryl suture.  The subcutaneous tissues were closed in several layers using 2-0 Vicryl interrupted sutures before the  skin was closed using staples. A sterile occlusive dressing was applied to the wound before the patient was placed into an abduction wedge pillow. The patient was then rolled back into the supine position on the hospital bed before being awakened and returned to the recovery room in satisfactory condition after tolerating the procedure well.  Ezzard FlaxScott Hailly Fess, DO

## 2017-01-08 NOTE — Anesthesia Post-op Follow-up Note (Cosign Needed)
Anesthesia QCDR form completed.        

## 2017-01-08 NOTE — Anesthesia Procedure Notes (Signed)
Procedure Name: Intubation Date/Time: 01/08/2017 11:11 AM Performed by: Aline Brochure Pre-anesthesia Checklist: Patient identified, Emergency Drugs available, Suction available and Patient being monitored Patient Re-evaluated:Patient Re-evaluated prior to inductionOxygen Delivery Method: Circle system utilized Preoxygenation: Pre-oxygenation with 100% oxygen Intubation Type: IV induction Ventilation: Mask ventilation without difficulty Laryngoscope Size: Mac and 4 Grade View: Grade I Tube type: Oral Tube size: 7.5 mm Number of attempts: 1 Airway Equipment and Method: Stylet Placement Confirmation: ETT inserted through vocal cords under direct vision,  positive ETCO2 and breath sounds checked- equal and bilateral Secured at: 20 cm Tube secured with: Tape Dental Injury: Teeth and Oropharynx as per pre-operative assessment

## 2017-01-08 NOTE — Progress Notes (Signed)
Post op note  Pain controlled. Sedated but cooperative with exam.  Vitals:   01/08/17 1407 01/08/17 1422  BP: (!) 160/102 116/79  Pulse: 92 88  Resp:    Temp:  97.5 F (36.4 C)   Toes up and down going BCR  A/P POD #0 s/p revision right hip hemiarthroplasty Pain control Lovenox in am Post op abx Labs in am PT/OT Knee immobilizer and abduction brace at all times  Ezzard FlaxScott MacDonald Please call [phone] with any questions

## 2017-01-08 NOTE — NC FL2 (Signed)
[place] MEDICAID FL2 LEVEL OF CARE SCREENING TOOL     IDENTIFICATION  Patient Name: Curtis Nunez Birthdate: 1927-06-02 Sex: male Admission Date (Current Location): 01/07/2017  [place] and [place] Number:  Chiropodist and Address:  [facility], [address] [place], [place] [address]      Provider Number: [id number]  Attending Physician Name and Address:  Houston Siren, MD  Relative Name and Phone Number:       Current Level of Care: Hospital Recommended Level of Care: Skilled Nursing Facility Prior Approval Number:    Date Approved/Denied:   PASRR Number: [id number]  Discharge Plan: SNF    Current Diagnoses: Patient Active Problem List   Diagnosis Date Noted  . Pressure injury of skin 01/08/2017  . Dislocation of right hip (HCC) 01/07/2017  . HTN (hypertension) 01/07/2017  . HLD (hyperlipidemia) 01/07/2017  . BPH (benign prostatic hyperplasia) 01/07/2017  . Gout 01/07/2017  . Dementia 01/07/2017  . Hip fracture (HCC) 12/16/2016    Orientation RESPIRATION BLADDER Height & Weight     Self, Time, Situation, Place  Normal Continent Weight: 128 lb 3.2 oz (58.2 kg) Height:  5\' 6"  (167.6 cm)  BEHAVIORAL SYMPTOMS/MOOD NEUROLOGICAL BOWEL NUTRITION STATUS      Continent Diet (NPO, to be advanced)  AMBULATORY STATUS COMMUNICATION OF NEEDS Skin   Extensive Assist Verbally Surgical wounds (Incision, right hip)                       Personal Care Assistance Level of Assistance  Bathing, Feeding, Dressing Bathing Assistance: Limited assistance Feeding assistance: Independent Dressing Assistance: Limited assistance     Functional Limitations Info    Sight Info: Adequate Hearing Info: Adequate Speech Info: Adequate    SPECIAL CARE FACTORS FREQUENCY  PT (By licensed PT)     PT Frequency: Up to 5X per day, 5 days per week OT Frequency: Up to 5X per day, 5 days per week            Contractures Contractures  Info: Present    Additional Factors Info  Allergies, Code Status Code Status Info: Full Allergies Info: NKA           Current Medications (01/08/2017):  This is the current hospital active medication list Current Facility-Administered Medications  Medication Dose Route Frequency Provider Last Rate Last Dose  . [MAR Hold] 0.9 %  sodium chloride infusion   Intravenous Once Melodye Ped, DO   Stopped at 01/07/17 2318  . [MAR Hold] acetaminophen (TYLENOL) tablet 650 mg  650 mg Oral Q6H PRN Oralia Manis, MD       Or  . Mitzi Hansen Hold] acetaminophen (TYLENOL) suppository 650 mg  650 mg Rectal Q6H PRN Oralia Manis, MD      . Mitzi Hansen Hold] allopurinol (ZYLOPRIM) tablet 100 mg  100 mg Oral Daily Oralia Manis, MD      . Mitzi Hansen Hold] aspirin EC tablet 81 mg  81 mg Oral Daily Oralia Manis, MD      . Mitzi Hansen Hold] ceFAZolin (ANCEF) IVPB 2g/100 mL premix  2 g Intravenous Q8H MacDonald, Scott R, DO   2 g at 01/08/17 1123  . fentaNYL (SUBLIMAZE) injection 25-50 mcg  25-50 mcg Intravenous Q5 min PRN Lenard Simmer, MD      . Mitzi Hansen Hold] omega-3 acid ethyl esters (LOVAZA) capsule 1 g  1 g Oral Daily Oralia Manis, MD      . Mitzi Hansen Hold] ondansetron [facility]) tablet  4 mg  4 mg Oral Q6H PRN Oralia ManisWillis, David, MD       Or  . Mitzi Hansen[MAR Hold] ondansetron [facility](ZOFRAN) injection 4 mg  4 mg Intravenous Q6H PRN Oralia ManisWillis, David, MD      . ondansetron [facility](ZOFRAN) injection 4 mg  4 mg Intravenous Once PRN Lenard SimmerKarenz, Andrew, MD      . Mitzi Hansen[MAR Hold] oxyCODONE (Oxy IR/ROXICODONE) immediate release tablet 5 mg  5 mg Oral Q4H PRN Oralia ManisWillis, David, MD   5 mg at 01/08/17 0010  . [MAR Hold] vancomycin (VANCOCIN) IVPB 1000 mg/200 mL premix  1,000 mg Intravenous On Call to OR Melodye PedMacDonald, Scott R, DO         Discharge Medications: Please see discharge summary for a list of discharge medications.  Relevant Imaging Results:  Relevant Lab Results:   Additional Information SS# [id number]  Judi CongKaren M Temprence Rhines, LCSW

## 2017-01-08 NOTE — Progress Notes (Addendum)
Patient is A&Ox1. PODxO f/ right hemi arthroplasty. Received general anesthegsia, continues to be drowsy but aroused, refusing to be repositioned. Patient has upper/lower dentures per family but not in hospital Patient from rehab center r/t recent surgery to right hip prior to this admission. Assist with nutrition intake. Ice to affected leg. Heels elevated with foam boots on.

## 2017-01-08 NOTE — Progress Notes (Signed)
[employer] - [place] at [facility]   PATIENT NAME: Curtis Nunez    MR#:  [id number]  DATE OF BIRTH:  02/28/1927  SUBJECTIVE:   Pt. Here due to a mild Leukocytosis but source unclear and noted to have right Hip dislocation.  Going to OR today for Right hip revision of right hip Hemi-arthroplasty today.  Family at bedside.   REVIEW OF SYSTEMS:    Review of Systems  Constitutional: Negative for chills and fever.  HENT: Negative for congestion and tinnitus.   Eyes: Negative for blurred vision and double vision.  Respiratory: Negative for cough, shortness of breath and wheezing.   Cardiovascular: Negative for chest pain, orthopnea and PND.  Gastrointestinal: Negative for abdominal pain, diarrhea, nausea and vomiting.  Genitourinary: Negative for dysuria and hematuria.  Neurological: Negative for dizziness, sensory change and focal weakness.  All other systems reviewed and are negative.   Nutrition: NPO for surgery Tolerating Diet: Yes Tolerating PT: Await Eval.   DRUG ALLERGIES:  No Known Allergies  VITALS:  Blood pressure (!) 160/75, pulse 90, temperature 97.6 F (36.4 C), temperature source Temporal, resp. rate 15, height 5\' 6"  (1.676 m), weight 58.2 kg (128 lb 3.2 oz), SpO2 98 %.  PHYSICAL EXAMINATION:   Physical Exam  GENERAL:  83-year-old patient lying in bed in no acute distress.  EYES: Pupils equal, round, reactive to light and accommodation. No scleral icterus. Extraocular muscles intact.  HEENT: Head atraumatic, normocephalic. Oropharynx and nasopharynx clear.  NECK:  Supple, no jugular venous distention. No thyroid enlargement, no tenderness.  LUNGS: Normal breath sounds bilaterally, no wheezing, rales, rhonchi. No use of accessory muscles of respiration.  CARDIOVASCULAR: S1, S2 normal. No murmurs, rubs, or gallops.  ABDOMEN: Soft, nontender, nondistended. Bowel sounds present. No organomegaly or mass.  EXTREMITIES: No cyanosis, clubbing or  edema b/l.   RLE externally rotated and shortened.   NEUROLOGIC: Cranial nerves II through XII are intact. No focal Motor or sensory deficits b/l.   PSYCHIATRIC: The patient is alert and oriented x 1.  SKIN: No obvious rash, lesion, or ulcer.    LABORATORY PANEL:   CBC  Recent Labs Lab 01/08/17 0306  WBC 12.3*  HGB 9.1*  HCT 27.0*  PLT 370   ------------------------------------------------------------------------------------------------------------------  Chemistries   Recent Labs Lab 01/07/17 1604 01/08/17 0306  NA 136 139  K 4.6 5.2*  CL 106 111  CO2 23 24  GLUCOSE 104* 105*  BUN 36* 34*  CREATININE 1.03 1.15  CALCIUM 8.0* 7.7*  AST 71*  --   ALT 48  --   ALKPHOS 123  --   BILITOT 0.5  --    ------------------------------------------------------------------------------------------------------------------  Cardiac Enzymes No results for input(s): TROPONINI in the last 168 hours. ------------------------------------------------------------------------------------------------------------------  RADIOLOGY:  Dg Chest 1 View  Result Date: 01/07/2017 CLINICAL DATA:  Crackles in chest EXAM: CHEST 1 VIEW COMPARISON:  December 19, 2016 FINDINGS: No pneumothorax. Cardiomegaly. The hila and mediastinum are normal. There is a skin fold over the lateral right chest with lung markings on both sides. Minimal opacity is seen in this region, possibly atelectasis or something on the patient. No evidence of pneumonia or overt edema. IMPRESSION: No active disease. Electronically Signed   By: Gerome Sam III M.D   On: 01/07/2017 17:33   Ct Pelvis Wo Contrast  Result Date: 01/07/2017 CLINICAL DATA:  83 year old male with acute right hip pain following probable fall. EXAM: CT PELVIS WITHOUT CONTRAST TECHNIQUE: Multidetector CT imaging of the pelvis  was performed following the standard protocol without intravenous contrast. COMPARISON:  Radiographs performed today. FINDINGS: Urinary  Tract:  Unremarkable. Bowel:  No definite abnormality.  Appendix normal. Vascular/Lymphatic: Aortic atherosclerotic calcification noted without aneurysm. No enlarged lymph nodes Reproductive:  Prostate enlargement noted Other:  No free fluid focal collection or pneumoperitoneum. Musculoskeletal: Proximal right femoral prosthesis is identified with dislocation of the femoral head component, which located lateral and slightly posterosuperior to the acetabulum. No definite fracture identified. Degenerative changes in the lower lumbar spine noted. IMPRESSION: Dislocation of the right femoral head prosthesis located lateral and slightly posterosuperior to the acetabulum. No definite fracture. Aortic Atherosclerosis (ICD10-I70.0). Electronically Signed   By: Harmon PierJeffrey  Hu M.D.   On: 01/07/2017 21:57   Dg Hip Unilat W Or Wo Pelvis 2-3 Views Right  Result Date: 01/07/2017 CLINICAL DATA:  Dislocation EXAM: DG HIP (WITH OR WITHOUT PELVIS) 2-3V RIGHT COMPARISON:  None. FINDINGS: The patient is status post right hip replacement. The replaced right hip is dislocated superiorly on the frontal view. No fractures are seen. IMPRESSION: Dislocated right hip Electronically Signed   By: Gerome Samavid  Williams III M.D   On: 01/07/2017 17:35     ASSESSMENT AND PLAN:   83 year old male with past medical history of dementia, recent right hip displacement, gout, hypertension, hyperlipidemia, BPH who presented to the hospital due to right hip pain and noted to have a right hip dislocation.  1. right hip dislocation-this is a cause of patient's worsening right lower extremity pain. -Seen by orthopedic surgery and status post right hip hemiarthroplasty revision today. -Continue further care as per orthopedics. Continue PT as tolerated. Next  2. Leukocytosis-mild. No evidence of infectious source. Urinalysis negative, chest x-ray negative on admission.  3. History of gout-no acute attack.  Continue allopurinol.  4. Hyperkalemia -  mild and will monitor.  - asymptomatic.      All the records are reviewed and case discussed with Care Management/Social Worker. Management plans discussed with the patient, family and they are in agreement.  CODE STATUS: Full code  DVT Prophylaxis: Lovenox  TOTAL TIME TAKING CARE OF THIS PATIENT: 30 minutes.   POSSIBLE D/C IN 1-2 DAYS, DEPENDING ON CLINICAL CONDITION.   Houston SirenSAINANI,Markeise Mathews J M.D on 01/08/2017 at 2:25 PM  Between 7am to 6pm - Pager - [phone]  After 6pm go to [email or web address] - Social research officer, governmentpassword [email or web address] [email or web address]  [facility] [place] [facility]  Office  [phone]  CC: Primary care physician; Marina GoodellFeldpausch, Dale E, MD

## 2017-01-08 NOTE — Anesthesia Postprocedure Evaluation (Signed)
Anesthesia Post Note  Patient: Curtis Nunez  Procedure(s) Performed: Procedure(s) (LRB): RIGHT HIP HEMIARTHROPLASTY  (Right)  Patient location during evaluation: PACU Anesthesia Type: General Level of consciousness: awake and alert Pain management: pain level controlled Vital Signs Assessment: post-procedure vital signs reviewed and stable Respiratory status: spontaneous breathing, nonlabored ventilation, respiratory function stable and patient connected to nasal cannula oxygen Cardiovascular status: blood pressure returned to baseline and stable Postop Assessment: no signs of nausea or vomiting Anesthetic complications: no     Last Vitals:  Vitals:   01/08/17 1422 01/08/17 1457  BP: 116/79 112/85  Pulse: 88 83  Resp:  16  Temp: 36.4 C (!) 35.7 C    Last Pain:  Vitals:   01/08/17 1457  TempSrc: Axillary  PainSc:                  Lenard SimmerAndrew Sharonann Malbrough

## 2017-01-08 NOTE — Anesthesia Preprocedure Evaluation (Signed)
Anesthesia Evaluation  Patient identified by MRN, date of birth, ID band Patient awake and Patient confused    Reviewed: Allergy & Precautions, H&P , NPO status , Patient's Chart, lab work & pertinent test results  History of Anesthesia Complications (+) PROLONGED EMERGENCE and history of anesthetic complications  Airway Mallampati: III  TM Distance: >3 FB Neck ROM: limited    Dental  (+) Poor Dentition, Missing, Edentulous Upper, Edentulous Lower   Pulmonary neg shortness of breath, pneumonia, resolved, former smoker,           Cardiovascular Exercise Tolerance: Good hypertension, (-) angina(-) Past MI      Neuro/Psych PSYCHIATRIC DISORDERS (Dementia) negative neurological ROS     GI/Hepatic negative GI ROS, Neg liver ROS,   Endo/Other  negative endocrine ROS  Renal/GU      Musculoskeletal   Abdominal   Peds  Hematology negative hematology ROS (+)   Anesthesia Other Findings Past Medical History: No date: Gout No date: Hypertension  History reviewed. No pertinent surgical history.  BMI    Body Mass Index:  24.28 kg/m      Reproductive/Obstetrics negative OB ROS                             Anesthesia Physical  Anesthesia Plan  ASA: III  Anesthesia Plan: General   Post-op Pain Management:    Induction: Intravenous  PONV Risk Score and Plan: 2 and Ondansetron, Dexamethasone and Treatment may vary due to age  Airway Management Planned: Oral ETT  Additional Equipment:   Intra-op Plan:   Post-operative Plan: Extubation in OR  Informed Consent: I have reviewed the patients History and Physical, chart, labs and discussed the procedure including the risks, benefits and alternatives for the proposed anesthesia with the patient or authorized representative who has indicated his/her understanding and acceptance.   Dental Advisory Given  Plan Discussed with:  Anesthesiologist, CRNA and Surgeon  Anesthesia Plan Comments: (Wife consented  Patient and family informed that patient is higher risk for complications from anesthesia during this procedure due to their medical history and age including but not limited to post operative cognitive dysfunction.  They voiced understanding.  Wife reports no bleeding problems and no anticoagulant use.  Plan for spinal with backup GA  Patient consented for risks of anesthesia including but not limited to:  - adverse reactions to medications - risk of bleeding, infection, nerve damage and headache - risk of failed spinal - damage to teeth, lips or other oral mucosa - sore throat or hoarseness - Damage to heart, brain, lungs or loss of life  Patient voiced understanding.)        Anesthesia Quick Evaluation

## 2017-01-08 NOTE — Plan of Care (Signed)
Problem: Skin Integrity: Goal: Risk for impaired skin integrity will decrease Outcome: Not Progressing Patient post op day 1, refusing to be repositioned.

## 2017-01-08 NOTE — OR Nursing (Signed)
Explanted stem and head from right hip

## 2017-01-09 ENCOUNTER — Encounter: Payer: Self-pay | Admitting: Orthopaedic Surgery

## 2017-01-09 LAB — BASIC METABOLIC PANEL
ANION GAP: 7 (ref 5–15)
BUN: 29 mg/dL — ABNORMAL HIGH (ref 6–20)
CALCIUM: 7.6 mg/dL — AB (ref 8.9–10.3)
CHLORIDE: 109 mmol/L (ref 101–111)
CO2: 20 mmol/L — AB (ref 22–32)
Creatinine, Ser: 1.25 mg/dL — ABNORMAL HIGH (ref 0.61–1.24)
GFR calc non Af Amer: 50 mL/min — ABNORMAL LOW (ref >60)
GFR, EST AFRICAN AMERICAN: 57 mL/min — AB (ref >60)
GLUCOSE: 124 mg/dL — AB (ref 65–99)
POTASSIUM: 4.1 mmol/L (ref 3.5–5.1)
Sodium: 136 mmol/L (ref 135–145)

## 2017-01-09 LAB — CBC
HEMATOCRIT: 23.1 % — AB (ref 40.0–52.0)
HEMOGLOBIN: 7.6 g/dL — AB (ref 13.0–18.0)
MCH: 30.5 pg (ref 26.0–34.0)
MCHC: 33.1 g/dL (ref 32.0–36.0)
MCV: 92.2 fL (ref 80.0–100.0)
Platelets: 345 10*3/uL (ref 150–440)
RBC: 2.51 MIL/uL — ABNORMAL LOW (ref 4.40–5.90)
RDW: 15.2 % — AB (ref 11.5–14.5)
WBC: 10.9 10*3/uL — AB (ref 3.8–10.6)

## 2017-01-09 NOTE — Progress Notes (Signed)
Orthopaedic progress note  Pain controlled. Baseline dementia. Cooperative with exam.   Vitals:   01/09/17 1501 01/09/17 2230  BP: (!) 117/58 (!) 118/58  Pulse: 81 79  Resp: 20 16  Temp: 98.7 F (37.1 C) 97.6 F (36.4 C)   Exam Incision is clean and dry Toes up and down going BCR  A/P POD #2 s/p revision right hip hemiarthroplasty Anemia- will transfuse one unit Pain control Lovenox  Labs pending PT/OT-mobilize  Dr. Ernest PineHooten to resume Pt care today.   Discharge instructions: Follow up with Dr. Joice LoftsPoggi in 10-14 days Continue posterior hip precautions.  Abduction pillow at all time while in bed. Continue knee immobilizer while in bed. Weight bear as tolerated.  Keep incision clean and dry at all time.  Sterile dressing on wound until follow up.  If drainage is noted from wound apply betadine bid.  Continue Lovenox for 4 wks.   Ezzard FlaxScott MacDonald Please call [phone] with any questions

## 2017-01-09 NOTE — Progress Notes (Signed)
[facility] - [place] at [facility]   PATIENT NAME: Curtis Nunez    MR#:  [id number]  DATE OF BIRTH:  10-27-1926  SUBJECTIVE:   Status post revision of right hip hemiarthroplasty. Postoperative day #1. Family at bedside. Awaiting physical therapy evaluation. No other acute events overnight.  REVIEW OF SYSTEMS:    Review of Systems  Unable to perform ROS: Dementia    Nutrition: Regular Tolerating Diet: Yes Tolerating PT: Await Eval.   DRUG ALLERGIES:  No Known Allergies  VITALS:  Blood pressure 117/62, pulse 67, temperature 98.4 F (36.9 C), temperature source Oral, resp. rate 18, height 5\' 6"  (1.676 m), weight 58.2 kg (128 lb 3.2 oz), SpO2 100 %.  PHYSICAL EXAMINATION:   Physical Exam  GENERAL:  83 y.o.-year-old patient lying in bed in no acute distress.  EYES: Pupils equal, round, reactive to light and accommodation. No scleral icterus. Extraocular muscles intact.  HEENT: Head atraumatic, normocephalic. Oropharynx and nasopharynx clear.  NECK:  Supple, no jugular venous distention. No thyroid enlargement, no tenderness.  LUNGS: Normal breath sounds bilaterally, no wheezing, rales, rhonchi. No use of accessory muscles of respiration.  CARDIOVASCULAR: S1, S2 normal. No murmurs, rubs, or gallops.  ABDOMEN: Soft, nontender, nondistended. Bowel sounds present. No organomegaly or mass.  EXTREMITIES: No cyanosis, clubbing or edema b/l.   Dressing on Right hip from surgery yesterday.   NEUROLOGIC: Cranial nerves II through XII are intact. No focal Motor or sensory deficits b/l.   PSYCHIATRIC: The patient is alert and oriented x 1.  SKIN: No obvious rash, lesion, or ulcer.    LABORATORY PANEL:   CBC  Recent Labs Lab 01/09/17 0850  WBC 10.9*  HGB 7.6*  HCT 23.1*  PLT 345   ------------------------------------------------------------------------------------------------------------------  Chemistries   Recent Labs Lab 01/07/17 1604  01/09/17 0850   NA 136  < > 136  K 4.6  < > 4.1  CL 106  < > 109  CO2 23  < > 20*  GLUCOSE 104*  < > 124*  BUN 36*  < > 29*  CREATININE 1.03  < > 1.25*  CALCIUM 8.0*  < > 7.6*  AST 71*  --   --   ALT 48  --   --   ALKPHOS 123  --   --   BILITOT 0.5  --   --   < > = values in this interval not displayed. ------------------------------------------------------------------------------------------------------------------  Cardiac Enzymes No results for input(s): TROPONINI in the last 168 hours. ------------------------------------------------------------------------------------------------------------------  RADIOLOGY:  Dg Chest 1 View  Result Date: 01/07/2017 CLINICAL DATA:  Crackles in chest EXAM: CHEST 1 VIEW COMPARISON:  December 19, 2016 FINDINGS: No pneumothorax. Cardiomegaly. The hila and mediastinum are normal. There is a skin fold over the lateral right chest with lung markings on both sides. Minimal opacity is seen in this region, possibly atelectasis or something on the patient. No evidence of pneumonia or overt edema. IMPRESSION: No active disease. Electronically Signed   By: Gerome Samavid  Williams III M.D   On: 01/07/2017 17:33   Dg Pelvis 1-2 Views  Result Date: 01/08/2017 CLINICAL DATA:  83 y/o male status post right hip hemiarthroplasty revision. EXAM: PELVIS - 1-2 VIEW COMPARISON:  CT 01/07/2017 and earlier. FINDINGS: Two AP portable views of the pelvis at 1415 hours. Right hip hemiarthroplasty versus bipolar type arthroplasty with intact hardware, and normally located femoral/acetabular component on these AP views. No unexpected osseous changes. Postoperative changes to the surrounding soft tissues including gas and  skin staples. Stable pelvis otherwise. Aortoiliac calcified atherosclerosis. IMPRESSION: Right hip arthroplasty revision with no adverse features identified. Electronically Signed   By: Odessa Fleming M.D.   On: 01/08/2017 14:31   Ct Pelvis Wo Contrast  Result Date: 01/07/2017 CLINICAL DATA:   83 year old male with acute right hip pain following probable fall. EXAM: CT PELVIS WITHOUT CONTRAST TECHNIQUE: Multidetector CT imaging of the pelvis was performed following the standard protocol without intravenous contrast. COMPARISON:  Radiographs performed today. FINDINGS: Urinary Tract:  Unremarkable. Bowel:  No definite abnormality.  Appendix normal. Vascular/Lymphatic: Aortic atherosclerotic calcification noted without aneurysm. No enlarged lymph nodes Reproductive:  Prostate enlargement noted Other:  No free fluid focal collection or pneumoperitoneum. Musculoskeletal: Proximal right femoral prosthesis is identified with dislocation of the femoral head component, which located lateral and slightly posterosuperior to the acetabulum. No definite fracture identified. Degenerative changes in the lower lumbar spine noted. IMPRESSION: Dislocation of the right femoral head prosthesis located lateral and slightly posterosuperior to the acetabulum. No definite fracture. Aortic Atherosclerosis (ICD10-I70.0). Electronically Signed   By: Harmon Pier M.D.   On: 01/07/2017 21:57   Dg Hip Unilat W Or Wo Pelvis 2-3 Views Right  Result Date: 01/07/2017 CLINICAL DATA:  Dislocation EXAM: DG HIP (WITH OR WITHOUT PELVIS) 2-3V RIGHT COMPARISON:  None. FINDINGS: The patient is status post right hip replacement. The replaced right hip is dislocated superiorly on the frontal view. No fractures are seen. IMPRESSION: Dislocated right hip Electronically Signed   By: Gerome Sam III M.D   On: 01/07/2017 17:35     ASSESSMENT AND PLAN:   83 year old male with past medical history of dementia, recent right hip displacement, gout, hypertension, hyperlipidemia, BPH who presented to the hospital due to right hip pain and noted to have a right hip dislocation.  1. right hip dislocation-this is the cause of patient's worsening right lower extremity pain. -Seen by orthopedic surgery and status post right hip hemiarthroplasty  revision POD # 1. -Continue further care as per orthopedics. Continue PT as tolerated.   2. Leukocytosis-mild. No evidence of infectious source. Urinalysis negative, chest x-ray negative on admission. - improving  3. History of gout-no acute attack.  Continue allopurinol.  4. Hyperkalemia - resolved.   5. Dementia - no acute issues.  Watch for sundowning at night.   Likely d/c to back to Rehab tomorrow.     All the records are reviewed and case discussed with Care Management/Social Worker. Management plans discussed with the patient, family and they are in agreement.  CODE STATUS: Full code  DVT Prophylaxis: Lovenox  TOTAL TIME TAKING CARE OF THIS PATIENT: 25 minutes.   POSSIBLE D/C IN 1-2 DAYS, DEPENDING ON CLINICAL CONDITION.   Houston Siren M.D on 01/09/2017 at 11:07 AM  Between 7am to 6pm - Pager - [phone]  After 6pm go to [email or web address] - Social research officer, government  [facility]  Hospitalists  Office  [phone]  CC: Primary care physician; Marina Goodell, MD

## 2017-01-09 NOTE — Clinical Social Work Placement (Signed)
   CLINICAL SOCIAL WORK PLACEMENT  NOTE  Date:  01/09/2017  Patient Details  Name: Trixie RudeRalph T Tinkey MRN: [id number] Date of Birth: 21-Jul-1926  Clinical Social Work is seeking post-discharge placement for this patient at the Skilled  Nursing Facility level of care (*CSW will initial, date and re-position this form in  chart as items are completed):  Yes   Patient/family provided with [facility] [place] Clinical Social Work Department's list of facilities offering this level of care within the geographic area requested by the patient (or if unable, by the patient's family).  Yes   Patient/family informed of their freedom to choose among providers that offer the needed level of care, that participate in Medicare, Medicaid or managed care program needed by the patient, have an available bed and are willing to accept the patient.  Yes   Patient/family informed of 's ownership interest in [facility] and [facility], as well as of the fact that they are under no obligation to receive care at these facilities.  PASRR submitted to EDS on       PASRR number received on 01/09/17     Existing PASRR number confirmed on 01/09/17     FL2 transmitted to all facilities in geographic area requested by pt/family on 01/09/17     FL2 transmitted to all facilities within larger geographic area on       Patient informed that his/her managed care company has contracts with or will negotiate with certain facilities, including the following:            Patient/family informed of bed offers received.  Patient chooses bed at       Physician recommends and patient chooses bed at      Patient to be transferred to   on  .  Patient to be transferred to facility by       Patient family notified on   of transfer.  Name of family member notified:        PHYSICIAN       Additional Comment:    _______________________________________________ Judi CongKaren M Yash Cacciola, LCSW 01/09/2017, 11:34 AM

## 2017-01-09 NOTE — Evaluation (Signed)
Physical Therapy Evaluation Patient Details Name: Curtis Nunez MRN: [id number] DOB: 20-Aug-1926 Today's Date: 01/09/2017   History of Present Illness  Patient is an 83 y/o male that presents with leukocytosis, noted to have R hip dislocation in the ED and bruising consitent with a possible fall. He was recently operated on R hip with hemi-arthroplasty to manage hip fx. Patient has a history of dementia.   Clinical Impression  Patient presents s/p hip hemiarthroplasty revision secondary to dislocation. Patient has severe cognitive limitations and is not able to follow commands more than 25% of the time. Discussed with on call orthopedist today and he reports patient can be WBAT with strict posterior hip precautions given previous dislocation. Patient requires near total assist for bed mobility and is not able to follow commands well enough to provide sufficient assistance in sit to stand transfer attempts. He is unable to consistently follow commands with there-ex either at this time. Patient will certainly need skilled rehabilitation once stable for discharge.     Follow Up Recommendations SNF    Equipment Recommendations       Recommendations for Other Services       Precautions / Restrictions Precautions Precautions: Fall;Posterior Hip Required Braces or Orthoses: Knee Immobilizer - Right Knee Immobilizer - Right: On at all times Restrictions Weight Bearing Restrictions: Yes RLE Weight Bearing: Weight bearing as tolerated Other Position/Activity Restrictions: Abduction pillow on when not attempting mobility. Strict posterior hip precautions.       Mobility  Bed Mobility Overal bed mobility: Needs Assistance Bed Mobility: Supine to Sit;Sit to Supine     Supine to sit: Max assist Sit to supine: Max assist   General bed mobility comments: Patient unable to follow commands for crossing arms, requires near total assistance for transfers.   Transfers                  General transfer comment: Unable to complete secondary to poor command following and weakness, multiple attempts made from various heights.   Ambulation/Gait                Stairs            Wheelchair Mobility    Modified Rankin (Stroke Patients Only)       Balance Overall balance assessment: Needs assistance Sitting-balance support: Feet supported;Bilateral upper extremity supported Sitting balance-Leahy Scale: Fair Sitting balance - Comments: Patient began to fatigue and lean to R and posteriorly attempting to return to bed. Postural control: Right lateral lean;Posterior lean                                   Pertinent Vitals/Pain Faces Pain Scale: Hurts even more Pain Location: R hip/LE  Pain Descriptors / Indicators: Operative site guarding;Moaning Pain Intervention(s): Limited activity within patient's tolerance;Monitored during session    Home Living Family/patient expects to be discharged to:: Skilled nursing facility Living Arrangements: Other (Comment)                    Prior Function           Comments: Patient was independent prior to initial injury, since that time he has been quite limited with mobility.      Hand Dominance        Extremity/Trunk Assessment                Communication   Communication:  (Patient has  fairly significant dementia and cannot follow commands consistently or provide meaningful communication )  Cognition Arousal/Alertness: Awake/alert Behavior During Therapy: WFL for tasks assessed/performed Overall Cognitive Status: History of cognitive impairments - at baseline                                        General Comments      Exercises Total Joint Exercises Ankle Circles/Pumps: AROM;5 reps;Both Straight Leg Raises: AROM;Left;5 reps   Assessment/Plan    PT Assessment Patient needs continued PT services  PT Problem List Decreased strength;Decreased activity  tolerance;Decreased balance;Decreased mobility;Decreased range of motion;Pain;Decreased knowledge of use of DME;Decreased safety awareness;Decreased knowledge of precautions;Decreased cognition       PT Treatment Interventions DME instruction;Gait training;Stair training;Functional mobility training;Neuromuscular re-education;Balance training;Therapeutic exercise;Therapeutic activities;Patient/family education    PT Goals (Current goals can be found in the Care Plan section)  Acute Rehab PT Goals PT Goal Formulation: Patient unable to participate in goal setting Time For Goal Achievement: 01/23/17 Potential to Achieve Goals: Poor    Frequency BID   Barriers to discharge        Co-evaluation               AM-PAC PT "6 Clicks" Daily Activity  Outcome Measure Difficulty turning over in bed (including adjusting bedclothes, sheets and blankets)?: Total Difficulty moving from lying on back to sitting on the side of the bed? : Total Difficulty sitting down on and standing up from a chair with arms (e.g., wheelchair, bedside commode, etc,.)?: Total Help needed moving to and from a bed to chair (including a wheelchair)?: Total Help needed walking in hospital room?: Total Help needed climbing 3-5 steps with a railing? : Total 6 Click Score: 6    End of Session Equipment Utilized During Treatment: Gait belt Activity Tolerance: Patient tolerated treatment well (Limited by weakness and cognition) Patient left: in bed;with bed alarm set;with call bell/phone within reach;with family/visitor present Nurse Communication: Mobility status PT Visit Diagnosis: Other abnormalities of gait and mobility (R26.89);Muscle weakness (generalized) (M62.81)    Time: 4098-1191 PT Time Calculation (min) (ACUTE ONLY): 29 min   Charges:   PT Evaluation $PT Eval Moderate Complexity: 1 Procedure      PT G Codes:   PT G-Codes **NOT FOR INPATIENT CLASS** Functional Assessment Tool Used: AM-PAC 6  Clicks Basic Mobility Functional Limitation: Mobility: Walking and moving around Mobility: Walking and Moving Around Current Status (Y7829): 100 percent impaired, limited or restricted Mobility: Walking and Moving Around Goal Status (F6213): At least 80 percent but less than 100 percent impaired, limited or restricted    Alva Garnet PT, DPT, CSCS    01/09/2017, 4:42 PM

## 2017-01-09 NOTE — Consult Note (Signed)
WOC Nurse wound consult note Reason for Consult: Bilateral heel pressure injuries Wound type:Pressure Pressure Injury POA: Yes.  Wife states the pressure injuries were sustained in Rehab facility. Measurement: R>L.  Left heel with partially deflated serum filled blister (Stage 2) measuring 2cm x 3cm. Right medial heel with deep purple area measuring 2.4 cm x 2cm with wider partial thickness area measuring 5cm round where it appears a ruptured blister has been removed/trimmed to reveal the wound bed. Wound bed:As described above Drainage (amount, consistency, odor) None Periwound: intact, dry and as otherwise described above. Sacrum is intact. Dressing procedure/placement/frequency: Patient is pleasantly confused, wife is present for assessment and provides supplemental, supportive information. Orders are provided for Nursing to placement of a prophylactic foam dressing and for bilateral heel pressure redistribution heel boots.  Wound care orders are provided for conservative therapy daily to the left heel (a white petrolatum dressing) and twice daily therapy to the deep tissue pressure injury (DTPI) on the right heel using a betadine swabstick to both disinfect and act as an astringent, allowing the betadine to dry and then to cover the affected tissue with dry dressings in an attempt to dry the dark tissue and hope it will act as a biologic "cap" until the tissue beneath has healed and it will eventually dry and fall off. Thank you for consulting our team and for allowing us to participate in this patient's POC. WOC nursing team will not follow, but will remain available to this patient, the nursing and medical teams.  Please re-consult if needed. Thanks, Ladona MowLaurie Geraldine Sandberg, MSN, RN, GNP, Hans EdenCWOCN, CWON-AP, FAAN  Pager# [phone]
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
## 2017-01-09 NOTE — Progress Notes (Signed)
Orthopaedic progress note  Pain controlled. Baseline dementia. Cooperative with exam.   Vitals:   01/09/17 0529 01/09/17 0724  BP: 127/75 117/62  Pulse: 69 67  Resp: 18 18  Temp: 97.8 F (36.6 C) 98.4 F (36.9 C)   .lab Incision is clean and dry Toes up and down going BCR  A/P POD #1 s/p revision right hip hemiarthroplasty Pain control Lovenox  Post op abx Labs in am PT/OT-mobilize  Knee immobilizer and abduction brace at all times  Ezzard FlaxScott MacDonald Please call [phone] with any questions

## 2017-01-10 ENCOUNTER — Encounter: Payer: Self-pay | Admitting: Orthopaedic Surgery

## 2017-01-10 LAB — BASIC METABOLIC PANEL
ANION GAP: 6 (ref 5–15)
BUN: 26 mg/dL — ABNORMAL HIGH (ref 6–20)
CHLORIDE: 108 mmol/L (ref 101–111)
CO2: 20 mmol/L — AB (ref 22–32)
CREATININE: 1.24 mg/dL (ref 0.61–1.24)
Calcium: 7.2 mg/dL — ABNORMAL LOW (ref 8.9–10.3)
GFR calc non Af Amer: 50 mL/min — ABNORMAL LOW (ref >60)
GFR, EST AFRICAN AMERICAN: 58 mL/min — AB (ref >60)
Glucose, Bld: 91 mg/dL (ref 65–99)
POTASSIUM: 4.4 mmol/L (ref 3.5–5.1)
SODIUM: 134 mmol/L — AB (ref 135–145)

## 2017-01-10 LAB — CBC WITH DIFFERENTIAL/PLATELET
BASOS ABS: 0 10*3/uL (ref 0–0.1)
BASOS PCT: 1 %
EOS ABS: 0.2 10*3/uL (ref 0–0.7)
Eosinophils Relative: 3 %
HEMATOCRIT: 20.8 % — AB (ref 40.0–52.0)
HEMOGLOBIN: 6.9 g/dL — AB (ref 13.0–18.0)
Lymphocytes Relative: 18 %
Lymphs Abs: 1.5 10*3/uL (ref 1.0–3.6)
MCH: 30.7 pg (ref 26.0–34.0)
MCHC: 33.1 g/dL (ref 32.0–36.0)
MCV: 93 fL (ref 80.0–100.0)
MONOS PCT: 8 %
Monocytes Absolute: 0.7 10*3/uL (ref 0.2–1.0)
NEUTROS ABS: 5.8 10*3/uL (ref 1.4–6.5)
NEUTROS PCT: 70 %
Platelets: 284 10*3/uL (ref 150–440)
RBC: 2.24 MIL/uL — AB (ref 4.40–5.90)
RDW: 15.3 % — ABNORMAL HIGH (ref 11.5–14.5)
WBC: 8.2 10*3/uL (ref 3.8–10.6)

## 2017-01-10 LAB — PREPARE RBC (CROSSMATCH)

## 2017-01-10 MED ORDER — SODIUM CHLORIDE 0.9 % IV SOLN: Freq: Once | INTRAVENOUS | Status: DC

## 2017-01-10 NOTE — Progress Notes (Addendum)
Clinical Child psychotherapistocial Worker (CSW) presented bed offers to patient's wife Claris GowerCharlotte. Per wife she will discuss offers with family and get back to CSW.   Patient's wife called CSW back and chose [facility]. Rick [facility] is aware of [facility] bed offer. Health Team case manager is aware of above.    [employer]Bailey Jaicion Laurie, LCSW [phone]

## 2017-01-10 NOTE — Progress Notes (Signed)
CN paged MD to alert of emesis, no response at this time.

## 2017-01-10 NOTE — Progress Notes (Addendum)
Clinical Child psychotherapistocial Worker (CSW) received a call from [facility] case manager stating that [facility] medical director is reviewing the case due to patient's dementia and ability to participate with PT. CSW will continue to follow and assist as needed.   Patient's wife Claris GowerCharlotte is aware of above and stated that she can't pay privately for SNF and will have to take patient home if Health Team doesn't approve SNF.    [employer]Bailey Brynda Heick, LCSW [phone]

## 2017-01-10 NOTE — Evaluation (Addendum)
Clinical/Bedside Swallow Evaluation Patient Details  Name: Curtis Nunez MRN: [id number] Date of Birth: [birth year]  Today's Date: 01/10/2017 Time: SLP Start Time (ACUTE ONLY): 1100 SLP Stop Time (ACUTE ONLY): 1200 SLP Time Calculation (min) (ACUTE ONLY): 60 min  Past Medical History:  Past Medical History:  Diagnosis Date  . BPH (benign prostatic hyperplasia)   . Dementia   . Gout   . HLD (hyperlipidemia)   . Hypertension    Past Surgical History:  Past Surgical History:  Procedure Laterality Date  . HIP ARTHROPLASTY Right 12/16/2016   Procedure: ARTHROPLASTY BIPOLAR HIP (HEMIARTHROPLASTY);  Surgeon: Christena Flake, MD;  Location: [facility] ORS;  Service: Orthopedics;  Laterality: Right;  . TOTAL HIP REVISION Right 01/08/2017   Procedure: RIGHT HIP HEMIARTHROPLASTY ;  Surgeon: Melodye Ped, DO;  Location: [facility] ORS;  Service: Orthopedics;  Laterality: Right;   HPI:   Pt is a 83 y.o. male w/ baseline dxs of Dementia, HTN, Gout who presents from [facility] for the complaint of elevated white blood cell count. Patient is demented and is unable to contribute to his history of present illness. History is taken from the nursing facility and from family. Family states that they were told by staff at [facility] a few days ago that the patient had a bruise in his right face from where he was laying against the railing of his bed. Family suspicious patient may have fallen. In the ED tonight he was found to have a right hip dislocation. Attempt to relocate his femur in the ED was unsuccessful, even under conscious sedation. Orthopedic surgery was contacted and plans to take the patient to the OR tomorrow. Hospitalists were called for admission. Currently, pt is confused and fidgity in the bed but could be redirected w/ moderate verbal cues and reducing environmental distractions/noise.  Assessment / Plan / Recommendation Clinical Impression  Pt appears to present w/ min  oropharyngeal phase dysphagia, moreso oral phase dysphagia secondary to decreased bolus awareness d/t declined Cognitive status(Dementia). This presentation can increase risk for aspiration to occur. Pt appeared to adequately tolerate trials of thin liquids w/ assistance w/ feeding along w/ trials of Puree foods w/ no immediate, overt s/s of aspiration noted; clear vocal quality post trials. Oral phase was grossly wfl for trial consistencies assessed today but solids were not tested d/t declined Cognitive status and missing lower dentition.  SLP Visit Diagnosis: Dysphagia, oropharyngeal phase (R13.12)    Aspiration Risk  Mild aspiration risk    Diet Recommendation  Dysphagia level 1 (puree) w/ Thin liquids - strict aspiration precautions; feeding support at meals  Medication Administration: Crushed with puree    Other  Recommendations Recommended Consults:  (Dietician f/u) Oral Care Recommendations: Oral care BID;Staff/trained caregiver to provide oral care   Follow up Recommendations None      Frequency and Duration min 2x/week  1 week       Prognosis Prognosis for Safe Diet Advancement: Fair Barriers to Reach Goals: Cognitive deficits;Severity of deficits      Swallow Study   General Date of Onset: 01/07/17 Type of Study: Bedside Swallow Evaluation Previous Swallow Assessment: none indicated Diet Prior to this Study: Dysphagia 3 (soft);Thin liquids (but has had "trouble" w/ the solids) Temperature Spikes Noted: No (wbc 8.2) Respiratory Status: Nasal cannula (2 liters) History of Recent Intubation: No Behavior/Cognition: Cooperative;Pleasant mood;Confused;Distractible;Requires cueing;Doesn't follow directions (awake) Oral Cavity Assessment: Within Functional Limits Oral Care Completed by SLP: Recent completion by staff Oral Cavity -  Dentition: Dentures, top (NO bottom dentition) Vision: Functional for self-feeding Self-Feeding Abilities: Total assist Patient Positioning:  Postural control adequate for testing (d/t Hip) Baseline Vocal Quality: Normal (mumbled speech) Volitional Cough: Cognitively unable to elicit Volitional Swallow: Unable to elicit    Oral/Motor/Sensory Function Overall Oral Motor/Sensory Function: Within functional limits (for bolus management and clearing)   Ice Chips Ice chips: Within functional limits Presentation: Spoon (fed; 3 trials) Other Comments: distracted   Thin Liquid Thin Liquid: Impaired Presentation: Cup;Straw (assisted; 7-8 trials total) Oral Phase Impairments: Poor awareness of bolus (confusion in taking po trials) Pharyngeal  Phase Impairments:  (no overt s/s of aspiration )    Nectar Thick Nectar Thick Liquid: Not tested   Honey Thick Honey Thick Liquid: Not tested   Puree Puree: Within functional limits Presentation: Spoon (fed; 8 trials)   Solid   GO   Solid: Not tested Other Comments: d/t declined Cognitive status; no bottom dentition          Jerilynn SomKatherine Terrence Pizana, MS, CCC-SLP Garron Eline 01/10/2017,5:16 PM

## 2017-01-10 NOTE — Progress Notes (Signed)
PT Cancellation Note  Patient Details Name: Curtis RudeRalph T Karen MRN: [id number] DOB: Dec 02, 1926   Cancelled Treatment:    Reason Eval/Treat Not Completed: Medical issues which prohibited therapy.  Pt's Hgb noted to be down-trending to 6.9 this morning.  Per PT guidelines for low hemoglobin, will hold PT at this time.  Per notes, plan for blood transfusion today.  Will re-attempt to see pt at a later time after blood transfusion is completed.  Hendricks LimesEmily Henrik Orihuela, PT 01/10/17, 8:36 AM [phone]

## 2017-01-10 NOTE — Progress Notes (Signed)
NT alerted this writer of vomiting episode. Assessed and emesis noted to be brown with foul odor. Estimated to 75ml or less. Alerted CN for assistance.

## 2017-01-10 NOTE — Progress Notes (Signed)
Patient received 2 units of blood this shift. Only 1 episode of emesis at this time. Alerted oncoming nurse of episode.

## 2017-01-10 NOTE — Progress Notes (Signed)
Dr. Konidena called for clarification of orders. Sliding scale was not given this morning before order changes were made. Verified with Dr. konidena and she wants the 12pm blood sugar checked now and give 12pm insulin now along with scheduled insulin 

## 2017-01-10 NOTE — Care Management Note (Addendum)
Case Management Note  Patient Details  Name: Curtis Nunez T Boven MRN: [id number] Date of Birth: [birth year]  Subjective/Objective:    Patient presents from [facility] Does not want to return there. CSW following.   Action/Plan:   Expected Discharge Date:  01/11/17               Expected Discharge Plan:  Skilled Nursing Facility  In-House Referral:  Clinical Social Work  Discharge planning Services     Post Acute Care Choice:    Choice offered to:     DME Arranged:    DME Agency:     HH Arranged:    HH Agency:     Status of Service:  In process, will continue to follow  If discussed at Long Length of Stay Meetings, dates discussed:    Additional Comments:  Marily MemosLisa M Srah Ake, RN 01/10/2017, 9:00 AM

## 2017-01-10 NOTE — Progress Notes (Signed)
[employer] [facility] - [place] at [facility]   PATIENT NAME: Curtis Nunez    MR#:  [id number]  DATE OF BIRTH:  [birth year]  SUBJECTIVE:   Status post revision of right hip hemiarthroplasty. Postoperative day #2. Wife at bedside.  A bit confused this a.m. Hg. Down to 6.9 today. Getting transfused today.  No other acute events overnight.   REVIEW OF SYSTEMS:    ROS  Nutrition: Regular Tolerating Diet: Yes Tolerating PT: Await Eval.   DRUG ALLERGIES:  No Known Allergies  VITALS:  Blood pressure (!) 115/59, pulse 74, temperature 98.5 F (36.9 C), temperature source Axillary, resp. rate 16, height 5\' 6"  (1.676 m), weight 58.2 kg (128 lb 3.2 oz), SpO2 98 %.  PHYSICAL EXAMINATION:   Physical Exam  GENERAL:  83 y.o.-year-old patient lying in bed confused but in NAD.   EYES: Pupils equal, round, reactive to light and accommodation. No scleral icterus. Extraocular muscles intact.  HEENT: Head atraumatic, normocephalic. Oropharynx and nasopharynx clear.  NECK:  Supple, no jugular venous distention. No thyroid enlargement, no tenderness.  LUNGS: Normal breath sounds bilaterally, no wheezing, rales, rhonchi. No use of accessory muscles of respiration.  CARDIOVASCULAR: S1, S2 normal. No murmurs, rubs, or gallops.  ABDOMEN: Soft, nontender, nondistended. Bowel sounds present. No organomegaly or mass.  EXTREMITIES: No cyanosis, clubbing or edema b/l.   Dressing on Right hip from surgery yesterday.   NEUROLOGIC: Cranial nerves II through XII are intact. No focal Motor or sensory deficits b/l.  Globally weak.  PSYCHIATRIC: The patient is alert and oriented x 1.  SKIN: No obvious rash, lesion, or ulcer.    LABORATORY PANEL:   CBC  Recent Labs Lab 01/10/17 0551  WBC 8.2  HGB 6.9*  HCT 20.8*  PLT 284   ------------------------------------------------------------------------------------------------------------------  Chemistries   Recent Labs Lab 01/07/17 1604   01/10/17 0551  NA 136  < > 134*  K 4.6  < > 4.4  CL 106  < > 108  CO2 23  < > 20*  GLUCOSE 104*  < > 91  BUN 36*  < > 26*  CREATININE 1.03  < > 1.24  CALCIUM 8.0*  < > 7.2*  AST 71*  --   --   ALT 48  --   --   ALKPHOS 123  --   --   BILITOT 0.5  --   --   < > = values in this interval not displayed. ------------------------------------------------------------------------------------------------------------------  Cardiac Enzymes No results for input(s): TROPONINI in the last 168 hours. ------------------------------------------------------------------------------------------------------------------  RADIOLOGY:  Dg Pelvis 1-2 Views  Result Date: 01/08/2017 CLINICAL DATA:  83 y/o male status post right hip hemiarthroplasty revision. EXAM: PELVIS - 1-2 VIEW COMPARISON:  CT 01/07/2017 and earlier. FINDINGS: Two AP portable views of the pelvis at 1415 hours. Right hip hemiarthroplasty versus bipolar type arthroplasty with intact hardware, and normally located femoral/acetabular component on these AP views. No unexpected osseous changes. Postoperative changes to the surrounding soft tissues including gas and skin staples. Stable pelvis otherwise. Aortoiliac calcified atherosclerosis. IMPRESSION: Right hip arthroplasty revision with no adverse features identified. Electronically Signed   By: Odessa Fleming M.D.   On: 01/08/2017 14:31     ASSESSMENT AND PLAN:   83 year old male with past medical history of dementia, recent right hip displacement, gout, hypertension, hyperlipidemia, BPH who presented to the hospital due to right hip pain and noted to have a right hip dislocation.  1. right hip dislocation-this is the cause of patient's  worsening right lower extremity pain. -Seen by orthopedic surgery and status post right hip hemiarthroplasty revision POD # 2. -Continue further care as per orthopedics. Continue PT as tolerated.   2. Acute blood loss Anemia - due to recent Hip surgery as  mentioned.  - will get transfused 2 units today and will follow Hg.    3. Leukocytosis-mild. No evidence of infectious source. Urinalysis negative, chest x-ray negative on admission. - resolved.  3. History of gout-no acute attack.  Continue allopurinol.  4. Hyperkalemia - resolved.   5. Dementia - no acute issues. - having some delirium sundowning today. Avoid Benzo's. Haldol PRN for agitation if needed.   Likely d/c to back to Rehab tomorrow if Hg. Stable. Discussed w/ wife at bedside.     All the records are reviewed and case discussed with Care Management/Social Worker. Management plans discussed with the patient, family and they are in agreement.  CODE STATUS: Full code  DVT Prophylaxis: Lovenox  TOTAL TIME TAKING CARE OF THIS PATIENT: 25 minutes.   POSSIBLE D/C IN 1-2 DAYS, DEPENDING ON CLINICAL CONDITION.   Houston SirenSAINANI,Nahome Bublitz J M.D on 01/10/2017 at 11:36 AM  Between 7am to 6pm - Pager - [phone]  After 6pm go to [email or web address] - Social research officer, governmentpassword EPAS [facility]  [facility] [place] [facility]  Office  [phone]  CC: Primary care physician; Marina GoodellFeldpausch, Dale E, MD

## 2017-01-10 NOTE — Progress Notes (Signed)
   Subjective: 2 Days Post-Op Procedure(s) (LRB): RIGHT HIP HEMIARTHROPLASTY  (Right) Patient reports pain as mild.   No complaints this am. Sleeping well. Patient is well, and has had no acute complaints or problems Do to dementia, therapy is a little slow Plan is to go Rehab after hospital stay. no nausea and no vomiting Patient denies any chest pains or shortness of breath. Objective: Vital signs in last 24 hours: Temp:  [97.6 F (36.4 C)-98.7 F (37.1 C)] 97.6 F (36.4 C) (07/01 2230) Pulse Rate:  [79-81] 79 (07/01 2230) Resp:  [16-20] 16 (07/01 2230) BP: (117-118)/(58) 118/58 (07/01 2230) SpO2:  [97 %-99 %] 97 % (07/01 2230) well approximated incision Heels are non tender and elevated off the bed using rolled towels Intake/Output from previous day: 07/01 0701 - 07/02 0700 In: 840 [P.O.:840] Out: -  Intake/Output this shift: No intake/output data recorded.   Recent Labs  01/07/17 1604 01/08/17 0306 01/09/17 0850 01/10/17 0551  HGB 9.9* 9.1* 7.6* 6.9*    Recent Labs  01/09/17 0850 01/10/17 0551  WBC 10.9* 8.2  RBC 2.51* 2.24*  HCT 23.1* 20.8*  PLT 345 284    Recent Labs  01/09/17 0850 01/10/17 0551  NA 136 134*  K 4.1 4.4  CL 109 108  CO2 20* 20*  BUN 29* 26*  CREATININE 1.25* 1.24  GLUCOSE 124* 91  CALCIUM 7.6* 7.2*   No results for input(s): LABPT, INR in the last 72 hours.  EXAM General - Patient is Alert, Appropriate and able to carry on a good conservation this am Extremity - Neurologically intact Neurovascular intact Sensation intact distally Intact pulses distally Dorsiflexion/Plantar flexion intact No cellulitis present Compartment soft Dressing - dressing C/D/I Motor Function - intact, moving foot and toes well on exam.    Past Medical History:  Diagnosis Date  . BPH (benign prostatic hyperplasia)   . Dementia   . Gout   . HLD (hyperlipidemia)   . Hypertension     Assessment/Plan: 2 Days Post-Op Procedure(s)  (LRB): RIGHT HIP HEMIARTHROPLASTY  (Right) Principal Problem:   Dislocation of right hip (HCC) Active Problems:   HTN (hypertension)   HLD (hyperlipidemia)   Gout   Pressure injury of skin   Right hip pain  Estimated body mass index is 20.69 kg/m as calculated from the following:   Height as of this encounter: 5\' 6"  (1.676 m).   Weight as of this encounter: 58.2 kg (128 lb 3.2 oz). Up with therapy Discharge to SNF when medically cleared  Labs: reviewed. hgb 6.9 DVT Prophylaxis - Lovenox, Foot Pumps and TED hose Weight-Bearing as tolerated to right  leg Pt needs a bowel movement Transfuse one unit prbc Labs in am F/u in [facility] in 6 weeks Strict posterior hip precautions Continue lovenox 30mg  bid for 14 days at discharge Please change dressing prior to d/c Staples to be removed 7/14 and apply benzoin and steri strips No shower until staples removed TED stocking are to be worn on both leg. May be removed one hour per 8 hr shift Form to be between legs at all times except with therapy  Cletis AthensJon R. [facility] Ridge Surgery Center LPWolfe PA [facility] 01/10/2017, 7:44 AM

## 2017-01-10 NOTE — Progress Notes (Signed)
Physical Therapy Treatment Patient Details Name: Curtis Nunez MRN: [id number] DOB: September 01, 1926 Today's Date: 01/10/2017    History of Present Illness Patient is an 83 y/o male that presents with leukocytosis, noted to have R hip dislocation in the ED and bruising consitent with a possible fall. He was recently operated on R hip with hemi-arthroplasty to manage hip fx. Patient has a history of dementia.     PT Comments    Pt agreeable to PT; spouse present and notes pt wishes to move his legs. Pt continuing to receive blood transfusion during session. Pt denies adverse symptoms and O2 saturation and heart rate within normal ranges. Pt unable to perform and active and very limited active assisted exercises. Several minimal quad sets with cues, but non further with attempts. Pt does not demonstrate any pain with movement maintaining safe ranges. Educated spouse on attempted ankle pumps/foot movement and isometrics throughout the day if pt responds more so with his spouse. Continue PT to progress strength, range, and endurance to improve functional toward baseline.    Follow Up Recommendations  SNF     Equipment Recommendations  None recommended by PT    Recommendations for Other Services       Precautions / Restrictions Precautions Precautions: Fall;Posterior Hip Required Braces or Orthoses: Knee Immobilizer - Right Knee Immobilizer - Right: On at all times Restrictions Weight Bearing Restrictions: Yes (strict posterior hip precautions) RLE Weight Bearing: Non weight bearing    Mobility  Bed Mobility               General bed mobility comments: Not tested; pt continuing to receiv blood transfusion   Transfers                    Ambulation/Gait                 Stairs            Wheelchair Mobility    Modified Rankin (Stroke Patients Only)       Balance                                            Cognition  Arousal/Alertness: Awake/alert Behavior During Therapy: Flat affect Overall Cognitive Status: History of cognitive impairments - at baseline                                 General Comments: Unable to follow instructions throughout session      Exercises Total Joint Exercises Ankle Circles/Pumps: PROM;Both;Supine;20 reps Quad Sets: Strengthening;Both (Only able to demonstrate a few times) Short Arc Quad: PROM;Left;AAROM;Supine;20 reps (only a few very limited AAROM ) Heel Slides: PROM;Left;Supine;20 reps Hip ABduction/ADduction: PROM;Both;20 reps;Supine Straight Leg Raises: PROM;Both;20 reps;Supine (Small range on R)    General Comments        Pertinent Vitals/Pain Pain Assessment: No/denies pain    Home Living                      Prior Function            PT Goals (current goals can now be found in the care plan section) Progress towards PT goals: Not progressing toward goals - comment    Frequency    BID      PT Plan Current  plan remains appropriate    Co-evaluation              AM-PAC PT "6 Clicks" Daily Activity  Outcome Measure  Difficulty turning over in bed (including adjusting bedclothes, sheets and blankets)?: Total Difficulty moving from lying on back to sitting on the side of the bed? : Total Difficulty sitting down on and standing up from a chair with arms (e.g., wheelchair, bedside commode, etc,.)?: Total Help needed moving to and from a bed to chair (including a wheelchair)?: Total Help needed walking in hospital room?: Total Help needed climbing 3-5 steps with a railing? : Total 6 Click Score: 6    End of Session   Activity Tolerance: Patient tolerated treatment well;Other (comment) (cognition) Patient left: in bed;with call bell/phone within reach;with bed alarm set;with family/visitor present   PT Visit Diagnosis: Other abnormalities of gait and mobility (R26.89);Muscle weakness (generalized) (M62.81)      Time: [id number] PT Time Calculation (min) (ACUTE ONLY): 27 min  Charges:  $Therapeutic Exercise: 23-37 mins                    G Codes:          Scot DockHeidi E Makeila Yamaguchi, PTA 01/10/2017, 3:22 PM

## 2017-01-10 NOTE — Progress Notes (Signed)
OT Cancellation Note  Patient Details Name: Curtis Nunez MRN: [id number] DOB: [birth year]   Cancelled Treatment:    Reason Eval/Treat Not Completed: Medical issues which prohibited therapy Order received and chart reviewed. Hgb noted to be down-trending to 6.9 this morning and will hold OT at this time per protocol.  Per notes, plan for blood transfusion today.  Will re-attempt to see pt later if medically appropriate.  Susanne BordersSusan Karley Pho, OTR/L ascom [phone] 01/10/17, 9:51 AM

## 2017-01-11 ENCOUNTER — Other Ambulatory Visit: Payer: Self-pay | Admitting: *Deleted

## 2017-01-11 DIAGNOSIS — R2689 Other abnormalities of gait and mobility: Secondary | ICD-10-CM | POA: Diagnosis not present

## 2017-01-11 DIAGNOSIS — D62 Acute posthemorrhagic anemia: Secondary | ICD-10-CM | POA: Diagnosis not present

## 2017-01-11 DIAGNOSIS — R262 Difficulty in walking, not elsewhere classified: Secondary | ICD-10-CM | POA: Diagnosis not present

## 2017-01-11 DIAGNOSIS — D7282 Elevated white blood cell count: Secondary | ICD-10-CM | POA: Diagnosis not present

## 2017-01-11 DIAGNOSIS — D509 Iron deficiency anemia, unspecified: Secondary | ICD-10-CM | POA: Diagnosis not present

## 2017-01-11 DIAGNOSIS — R1310 Dysphagia, unspecified: Secondary | ICD-10-CM | POA: Diagnosis not present

## 2017-01-11 DIAGNOSIS — Z7401 Bed confinement status: Secondary | ICD-10-CM | POA: Diagnosis not present

## 2017-01-11 DIAGNOSIS — I10 Essential (primary) hypertension: Secondary | ICD-10-CM | POA: Diagnosis not present

## 2017-01-11 DIAGNOSIS — N401 Enlarged prostate with lower urinary tract symptoms: Secondary | ICD-10-CM | POA: Diagnosis not present

## 2017-01-11 DIAGNOSIS — S90822A Blister (nonthermal), left foot, initial encounter: Secondary | ICD-10-CM | POA: Diagnosis not present

## 2017-01-11 DIAGNOSIS — S73 Unspecified subluxation and dislocation of hip: Secondary | ICD-10-CM | POA: Diagnosis not present

## 2017-01-11 DIAGNOSIS — F0390 Unspecified dementia without behavioral disturbance: Secondary | ICD-10-CM | POA: Diagnosis not present

## 2017-01-11 DIAGNOSIS — S73004D Unspecified dislocation of right hip, subsequent encounter: Secondary | ICD-10-CM | POA: Diagnosis not present

## 2017-01-11 DIAGNOSIS — M25559 Pain in unspecified hip: Secondary | ICD-10-CM | POA: Diagnosis not present

## 2017-01-11 DIAGNOSIS — R296 Repeated falls: Secondary | ICD-10-CM | POA: Diagnosis not present

## 2017-01-11 DIAGNOSIS — W19XXXD Unspecified fall, subsequent encounter: Secondary | ICD-10-CM | POA: Diagnosis not present

## 2017-01-11 DIAGNOSIS — M109 Gout, unspecified: Secondary | ICD-10-CM | POA: Diagnosis not present

## 2017-01-11 DIAGNOSIS — M25551 Pain in right hip: Secondary | ICD-10-CM | POA: Diagnosis not present

## 2017-01-11 DIAGNOSIS — Z8781 Personal history of (healed) traumatic fracture: Secondary | ICD-10-CM | POA: Diagnosis not present

## 2017-01-11 DIAGNOSIS — L89610 Pressure ulcer of right heel, unstageable: Secondary | ICD-10-CM | POA: Diagnosis not present

## 2017-01-11 DIAGNOSIS — E785 Hyperlipidemia, unspecified: Secondary | ICD-10-CM | POA: Diagnosis not present

## 2017-01-11 DIAGNOSIS — M6281 Muscle weakness (generalized): Secondary | ICD-10-CM | POA: Diagnosis not present

## 2017-01-11 LAB — BPAM RBC
BLOOD PRODUCT EXPIRATION DATE: 201807162359
Blood Product Expiration Date: 201807162359
ISSUE DATE / TIME: 201807021017
ISSUE DATE / TIME: 201807021543
UNIT TYPE AND RH: 5100
Unit Type and Rh: 5100

## 2017-01-11 LAB — TYPE AND SCREEN
ABO/RH(D): O POS
ANTIBODY SCREEN: NEGATIVE
UNIT DIVISION: 0
Unit division: 0

## 2017-01-11 LAB — CBC
HEMATOCRIT: 27.2 % — AB (ref 40.0–52.0)
HEMOGLOBIN: 9.4 g/dL — AB (ref 13.0–18.0)
MCH: 31.1 pg (ref 26.0–34.0)
MCHC: 34.5 g/dL (ref 32.0–36.0)
MCV: 90.3 fL (ref 80.0–100.0)
Platelets: 254 10*3/uL (ref 150–440)
RBC: 3.01 MIL/uL — AB (ref 4.40–5.90)
RDW: 15.1 % — ABNORMAL HIGH (ref 11.5–14.5)
WBC: 8.4 10*3/uL (ref 3.8–10.6)

## 2017-01-11 MED ORDER — BISACODYL 10 MG RE SUPP
10.0000 mg | Freq: Once | RECTAL | Status: AC
  Administered 2017-01-11: 10 mg via RECTAL
  Filled 2017-01-11: qty 1

## 2017-01-11 MED ORDER — FLEET ENEMA 7-19 GM/118ML RE ENEM
1.0000 | ENEMA | Freq: Once | RECTAL | Status: AC
  Administered 2017-01-11: 1 via RECTAL

## 2017-01-11 MED ORDER — LACTULOSE 10 GM/15ML PO SOLN
30.0000 g | Freq: Once | ORAL | Status: AC
  Administered 2017-01-11: 30 g via ORAL
  Filled 2017-01-11: qty 60

## 2017-01-11 MED ORDER — BISACODYL 5 MG PO TBEC
10.0000 mg | DELAYED_RELEASE_TABLET | Freq: Every day | ORAL | Status: DC | PRN
  Administered 2017-01-11: 10 mg via ORAL
  Filled 2017-01-11: qty 2

## 2017-01-11 MED ORDER — ENOXAPARIN SODIUM 40 MG/0.4ML ~~LOC~~ SOLN: 40.0000 mg | SUBCUTANEOUS | 0 refills | Status: DC

## 2017-01-11 MED ORDER — ACETAMINOPHEN 325 MG PO TABS: 650.0000 mg | ORAL_TABLET | Freq: Four times a day (QID) | ORAL | Status: AC | PRN

## 2017-01-11 NOTE — Progress Notes (Signed)
Patient is medically stable for D/C to [facility] today. [facility] room A-10, bed 1. [facility] authorization has been received, auth # [id number]. RN will call report and arrange EMS for transport. Clinical Child psychotherapistocial Worker (CSW) [facility] [employer] [facility]. Patient's wife Claris GowerCharlotte is at bedside and aware of above. Please reconsult if future social work needs arise. CSW signing off.   Baker Hughes IncorporatedBailey Nikiah Goin, LCSW [phone]

## 2017-01-11 NOTE — Progress Notes (Signed)
   Subjective: 3 Days Post-Op Procedure(s) (LRB): RIGHT HIP HEMIARTHROPLASTY  (Right) Patient reports pain as 0 on 0-10 scale.   Patient is well, and has had no acute complaints or problems Therapy was not performed yesterday morning secondary to low hemoglobin and only bed exercises in the afternoon secondary to patient receiving blood transfusion. Plan is to go Rehab after hospital stay. no nausea and no vomiting Patient denies any chest pains or shortness of breath. Patient with sleeping this a.m. and subsequently was a little more problems are then normal.   Objective: Vital signs in last 24 hours: Temp:  [97.9 F (36.6 C)-98.6 F (37 C)] 98.6 F (37 C) (07/02 2153) Pulse Rate:  [60-85] 79 (07/02 2153) Resp:  [16-18] 18 (07/02 2153) BP: (104-143)/(59-77) 127/60 (07/02 2153) SpO2:  [85 %-100 %] 99 % (07/02 2153) well approximated incision Heels are non tender and elevated off the bed using rolled towels Intake/Output from previous day: 07/02 0701 - 07/03 0700 In: 1170 [P.O.:360; Blood:810] Out: -  Intake/Output this shift: No intake/output data recorded.   Recent Labs  01/09/17 0850 01/10/17 0551 01/11/17 0402  HGB 7.6* 6.9* 9.4*    Recent Labs  01/10/17 0551 01/11/17 0402  WBC 8.2 8.4  RBC 2.24* 3.01*  HCT 20.8* 27.2*  PLT 284 254    Recent Labs  01/09/17 0850 01/10/17 0551  NA 136 134*  K 4.1 4.4  CL 109 108  CO2 20* 20*  BUN 29* 26*  CREATININE 1.25* 1.24  GLUCOSE 124* 91  CALCIUM 7.6* 7.2*   No results for input(s): LABPT, INR in the last 72 hours.  EXAM General - Patient is Alert and Appropriate Extremity - Neurologically intact Neurovascular intact Sensation intact distally Intact pulses distally Dorsiflexion/Plantar flexion intact No cellulitis present Compartment soft Dressing - scant drainage which was mostly serosanguineous drainage. I do not appreciate any blood. Motor Function - intact, moving foot and toes well on exam.     Past Medical History:  Diagnosis Date  . BPH (benign prostatic hyperplasia)   . Dementia   . Gout   . HLD (hyperlipidemia)   . Hypertension     Assessment/Plan: 3 Days Post-Op Procedure(s) (LRB): RIGHT HIP HEMIARTHROPLASTY  (Right) Principal Problem:   Dislocation of right hip (HCC) Active Problems:   HTN (hypertension)   HLD (hyperlipidemia)   Gout   Pressure injury of skin   Right hip pain  Estimated body mass index is 20.69 kg/m as calculated from the following:   Height as of this encounter: 5\' 6"  (1.676 m).   Weight as of this encounter: 58.2 kg (128 lb 3.2 oz). Up with therapy Discharge to SNF  Labs: Were reviewed. Hemoglobin 9.5. DVT Prophylaxis - Lovenox, Foot Pumps and TED hose Weight-Bearing as tolerated to right leg Patient needs a bowel movement today. [facility] weeks Strict posterior hip precautions Continue lovenox 30mg  bid for 14 days at discharge Please change dressing prior to d/c Staples to be removed 7/14 and apply benzoin and steri strips No shower until staples removed TED stocking are to be worn on both leg. May be removed one hour per 8 hr shift Form to be between legs at all times except with therapy   Cletis AthensJon R. [facility] CenterWolfe PA [facility] 01/11/2017, 7:26 AM

## 2017-01-11 NOTE — Progress Notes (Signed)
Physical Therapy Treatment Patient Details Name: Curtis Nunez MRN: [id number] DOB: 07/04/1927 Today's Date: 01/11/2017    History of Present Illness Patient is an 83 y/o male that presents with leukocytosis, noted to have R hip dislocation in the ED and bruising consitent with a possible fall. He was recently operated on R hip with hemi-arthroplasty to manage hip fx. Patient has a history of dementia.     PT Comments    Pt lethargic and even less responsive today than previous session. Verbally and with facial expression denies pain at rest; pt expresses 8/10 pain with facial expression with limited ranges with movement of Bilateral lower extremities. Offered edge of bed sitting, but pt refuses. Further attempts at mobility would be unsafe at this time due to inability to follow instructions and R NWB status.   Follow Up Recommendations  SNF     Equipment Recommendations  None recommended by PT    Recommendations for Other Services       Precautions / Restrictions Precautions Precautions: Fall;Posterior Hip Required Braces or Orthoses: Knee Immobilizer - Right Knee Immobilizer - Right: On at all times Restrictions Weight Bearing Restrictions: Yes RLE Weight Bearing: Non weight bearing    Mobility  Bed Mobility               General bed mobility comments: Not tested due to lethargy and congnitive issues. Pt also refuses edge of bed sitting  Transfers                    Ambulation/Gait             General Gait Details: Unable due to R NWB restriction and inability to follow instructions   Stairs            Wheelchair Mobility    Modified Rankin (Stroke Patients Only)       Balance                                            Cognition Arousal/Alertness: Lethargic Behavior During Therapy: Flat affect Overall Cognitive Status: History of cognitive impairments - at baseline                                  General Comments: Unable to follow instructions throughout session      Exercises Total Joint Exercises Ankle Circles/Pumps: PROM;Both;Supine;20 reps Quad Sets:  (attempted briefly; unable) Short Arc Quad: PROM;Left;20 reps;Supine Heel Slides: PROM;Left;Supine;20 reps Hip ABduction/ADduction: PROM;Both;20 reps;Supine Straight Leg Raises: PROM;20 reps;Supine;Left (R small range 5x with facial grimacing)    General Comments        Pertinent Vitals/Pain Pain Assessment: Faces Faces Pain Scale: Hurts whole lot (denies at rest, no faces at rest) Pain Location: R hip/LE  Pain Intervention(s): Limited activity within patient's tolerance;Monitored during session    Home Living                      Prior Function            PT Goals (current goals can now be found in the care plan section) Progress towards PT goals: Not progressing toward goals - comment    Frequency    BID      PT Plan Current plan remains appropriate    Co-evaluation  AM-PAC PT "6 Clicks" Daily Activity  Outcome Measure  Difficulty turning over in bed (including adjusting bedclothes, sheets and blankets)?: Total Difficulty moving from lying on back to sitting on the side of the bed? : Total Difficulty sitting down on and standing up from a chair with arms (e.g., wheelchair, bedside commode, etc,.)?: Total Help needed moving to and from a bed to chair (including a wheelchair)?: Total Help needed walking in hospital room?: Total Help needed climbing 3-5 steps with a railing? : Total 6 Click Score: 6    End of Session   Activity Tolerance: Patient limited by lethargy;Patient limited by pain;Other (comment) (cognition) Patient left: in bed;with call bell/phone within reach;with bed alarm set   PT Visit Diagnosis: Other abnormalities of gait and mobility (R26.89);Muscle weakness (generalized) (M62.81)     Time: [id number] PT Time Calculation (min) (ACUTE ONLY): 19  min  Charges:  $Therapeutic Exercise: 8-22 mins                    G Codes:        Scot DockHeidi E Nichoel Digiulio, PTA 01/11/2017, 11:28 AM

## 2017-01-11 NOTE — Patient Outreach (Signed)
[facility] Care Management  01/11/2017  Curtis Nunez 08-26-1926 [id number]   Disregard note, opened in error. Alben SpittleMary E. Niemczura, RN, BSN, CCM

## 2017-01-11 NOTE — Clinical Social Work Placement (Signed)
   CLINICAL SOCIAL WORK PLACEMENT  NOTE  Date:  01/11/2017  Patient Details  Name: Curtis Nunez MRN: [id number] Date of Birth: May 04, 1927  Clinical Social Work is seeking post-discharge placement for this patient at the Skilled  Nursing Facility level of care (*CSW will initial, date and re-position this form in  chart as items are completed):  Yes   Patient/family provided with [facility] Clinical Social Work Department's list of facilities offering this level of care within the geographic area requested by the patient (or if unable, by the patient's family).  Yes   Patient/family informed of their freedom to choose among providers that offer the needed level of care, that participate in Medicare, Medicaid or managed care program needed by the patient, have an available bed and are willing to accept the patient.  Yes   Patient/family informed of 's ownership interest in [facility] and [facility], as well as of the fact that they are under no obligation to receive care at these facilities.  PASRR submitted to EDS on       PASRR number received on 01/09/17     Existing PASRR number confirmed on 01/09/17     FL2 transmitted to all facilities in geographic area requested by pt/family on 01/09/17     FL2 transmitted to all facilities within larger geographic area on       Patient informed that his/her managed care company has contracts with or will negotiate with certain facilities, including the following:        Yes   Patient/family informed of bed offers received.  Patient chooses bed at  [facility] )     Physician recommends and patient chooses bed at      Patient to be transferred to  [facility] ) on 01/11/17.  Patient to be transferred to facility by  [facility] )     Patient family notified on 01/11/17 of transfer.  Name of family member notified:   (Patient's wife Claris GowerCharlotte is at bedside and aware of D/C today. )     PHYSICIAN        Additional Comment:    _______________________________________________ Priti Consoli, Darleen CrockerBailey M, LCSW 01/11/2017, 2:03 PM

## 2017-01-11 NOTE — NC FL2 (Signed)
[place] MEDICAID FL2 LEVEL OF CARE SCREENING TOOL     IDENTIFICATION  Patient Name: Curtis Nunez Birthdate: Apr 28, 1927 Sex: male Admission Date (Current Location): 01/07/2017  [place] and [place] Number:  Chiropodist and Address:  [facility], [address] [place], [place] [address]      Provider Number: [id number]  Attending Physician Name and Address:  Houston Siren, MD  Relative Name and Phone Number:       Current Level of Care: Hospital Recommended Level of Care: Skilled Nursing Facility Prior Approval Number:    Date Approved/Denied:   PASRR Number: [id number]  Discharge Plan: SNF    Current Diagnoses: Patient Active Problem List   Diagnosis Date Noted  . Pressure injury of skin 01/08/2017  . Right hip pain 01/08/2017  . Dislocation of right hip (HCC) 01/07/2017  . HTN (hypertension) 01/07/2017  . HLD (hyperlipidemia) 01/07/2017  . BPH (benign prostatic hyperplasia) 01/07/2017  . Gout 01/07/2017  . Dementia 01/07/2017  . Hip fracture (HCC) 12/16/2016    Orientation RESPIRATION BLADDER Height & Weight     Self, Time, Situation, Place  Normal Continent Weight: 128 lb 3.2 oz (58.2 kg) Height:  5\' 6"  (167.6 cm)  BEHAVIORAL SYMPTOMS/MOOD NEUROLOGICAL BOWEL NUTRITION STATUS      Continent Dysphagia level 1 (puree) w/ gravy added to moisten; Thin Liquids.Aspiration precautions; Pills in Puree - Crushed as able. Feeding Support - reduce distractions at meals.  AMBULATORY STATUS COMMUNICATION OF NEEDS Skin   Extensive Assist Verbally Surgical wounds (Incision, right hip)                       Personal Care Assistance Level of Assistance  Bathing, Feeding, Dressing Bathing Assistance: Limited assistance Feeding assistance: Independent Dressing Assistance: Limited assistance     Functional Limitations Info    Sight Info: Adequate Hearing Info: Adequate Speech Info: Adequate    SPECIAL CARE FACTORS  FREQUENCY  PT (By licensed PT)     PT Frequency: Up to 5X per day, 5 days per week OT Frequency: Up to 5X per day, 5 days per week            Contractures Contractures Info: Present    Additional Factors Info  Allergies, Code Status Code Status Info: Full Allergies Info: NKA           Current Medications (01/11/2017):  This is the current hospital active medication list Current Facility-Administered Medications  Medication Dose Route Frequency Provider Last Rate Last Dose  . 0.9 %  sodium chloride infusion   Intravenous Once Melodye Ped, DO   Stopped at 01/07/17 2318  . 0.9 %  sodium chloride infusion   Intravenous Once Ezzard Flax R, DO      . acetaminophen (TYLENOL) tablet 650 mg  650 mg Oral Q6H PRN Oralia Manis, MD       Or  . acetaminophen (TYLENOL) suppository 650 mg  650 mg Rectal Q6H PRN Oralia Manis, MD      . allopurinol (ZYLOPRIM) tablet 100 mg  100 mg Oral Daily Oralia Manis, MD   100 mg at 01/11/17 1000  . aspirin EC tablet 81 mg  81 mg Oral Daily Oralia Manis, MD   81 mg at 01/11/17 0936  . bisacodyl (DULCOLAX) EC tablet 10 mg  10 mg Oral Daily PRN Houston Siren, MD   10 mg at 01/11/17 0936  . bisacodyl (DULCOLAX) suppository 10 mg  10 mg  Rectal Once Houston SirenSainani, Vivek J, MD      . docusate sodium (COLACE) capsule 100 mg  100 mg Oral BID Ezzard FlaxMacDonald, Scott R, DO   100 mg at 01/11/17 0936  . enoxaparin (LOVENOX) injection 40 mg  40 mg Subcutaneous Q24H MacDonald, Scott R, DO   40 mg at 01/11/17 0936  . feeding supplement (ENSURE ENLIVE) (ENSURE ENLIVE) liquid 237 mL  237 mL Oral BID BM MacDonald, Scott R, DO   237 mL at 01/09/17 1503  . omega-3 acid ethyl esters (LOVAZA) capsule 1 g  1 g Oral Daily Oralia ManisWillis, David, MD   1 g at 01/11/17 0936  . ondansetron (ZOFRAN) tablet 4 mg  4 mg Oral Q6H PRN Oralia ManisWillis, David, MD       Or  . ondansetron [facility](ZOFRAN) injection 4 mg  4 mg Intravenous Q6H PRN Oralia ManisWillis, David, MD      . oxyCODONE (Oxy IR/ROXICODONE) immediate  release tablet 5 mg  5 mg Oral Q4H PRN Oralia ManisWillis, David, MD   5 mg at 01/09/17 2002  . sodium phosphate (FLEET) 7-19 GM/118ML enema 1 enema  1 enema Rectal Once Sainani, Rolly PancakeVivek J, MD         Discharge Medications: Please see discharge summary for a list of discharge medications.  Relevant Imaging Results:  Relevant Lab Results:   Additional Information SS# [id number]  Chrisean Kloth, Darleen CrockerBailey M, [place]

## 2017-01-11 NOTE — Evaluation (Signed)
Occupational Therapy Evaluation Patient Details Name: COSME JACOB MRN: [id number] DOB: [birth year] Today's Date: 01/11/2017    History of Present Illness Pt. is an 83 y.o. male who was admitted to [facility] with Leukocytosis. Pt. underwent hemi-arthroplasty repair of a right hip fracture. Pt. has a history of Dementia.   Clinical Impression   Pt. Is an 83 y.o. Male who was admitted to [facility]  Who underwent a hemi-arthroplasty repair of a right hip fracture. Pt. Presents with weakness, impaired cognition, Dementia, weakness, decreased functional mobility, and strict posterior hip precautions. Pt., could benefit from follow-up OT services at the next venue of care, as pt. family plans to have him discharge this afternoon to SNF.    Follow Up Recommendations  SNF    Equipment Recommendations       Recommendations for Other Services       Precautions / Restrictions Precautions Precautions: Fall;Posterior Hip Precaution Booklet Issued: Yes (comment) Required Braces or Orthoses: Knee Immobilizer - Right Restrictions Weight Bearing Restrictions: Yes RLE Weight Bearing: Non weight bearing                                                    ADL either performed or assessed with clinical judgement   ADL Overall ADL's : Needs assistance/impaired Eating/Feeding: Moderate assistance   Grooming: Maximal assistance   Upper Body Bathing: Maximal assistance   Lower Body Bathing: Total assistance   Upper Body Dressing : Total assistance   Lower Body Dressing: Total assistance                       Vision  No change noted.       Perception     Praxis      Pertinent Vitals/Pain Pain Assessment: Faces Pain Score: 8  Pain Location: Right Hip/LE Pain Descriptors / Indicators: Operative site guarding Pain Intervention(s): Limited activity within patient's tolerance;Monitored during session;Patient requesting pain meds-RN notified     Hand Dominance     Extremity/Trunk Assessment Upper Extremity Assessment Upper Extremity Assessment: Generalized weakness           Communication     Cognition Arousal/Alertness: Awake/alert Behavior During Therapy: Restless;Flat affect Overall Cognitive Status: History of cognitive impairments - at baseline                                     General Comments       Exercises     Shoulder Instructions      Home Living Family/patient expects to be discharged to:: Skilled nursing facility Living Arrangements: Other (Comment) Available Help at Discharge: Family;Available 24 hours/day Type of Home: House Home Access: Stairs to enter [employer] of Steps: 3 Entrance Stairs-Rails: Right Home Layout: One level               Home Equipment: Walker - 2 wheels          Prior Functioning/Environment Level of Independence: Needs assistance    ADL's / Homemaking Assistance Needed: Independent with self-feeding. WIfe assist with ADL, IADL tasks.            OT Problem List: Decreased strength;Decreased range of motion;Decreased safety awareness;Decreased cognition;Pain;Decreased activity tolerance;Impaired UE functional use;Decreased knowledge of precautions;Decreased knowledge of use of  DME or AE      OT Treatment/Interventions:      OT Goals(Current goals can be found in the care plan section)    OT Frequency:     Barriers to D/C:            Co-evaluation              AM-PAC PT "6 Clicks" Daily Activity     Outcome Measure Help from another person eating meals?: A Little Help from another person taking care of personal grooming?: A Lot Help from another person toileting, which includes using toliet, bedpan, or urinal?: Total Help from another person bathing (including washing, rinsing, drying)?: Total Help from another person to put on and taking off regular upper body clothing?: A Lot Help from another person to put on and taking off  regular lower body clothing?: Total 6 Click Score: 10   End of Session    Activity Tolerance: Patient limited by pain Patient left: with call bell/phone within reach;in bed;with family/visitor present;with bed alarm set  OT Visit Diagnosis: Muscle weakness (generalized) (M62.81);Cognitive communication deficit (R41.841);Pain                Time: 1452-1510 OT Time Calculation (min): 18 min Charges:  OT General Charges $OT Visit: 1 Procedure OT Evaluation $OT Eval Moderate Complexity: 1 Procedure G-Codes:     Olegario MessierElaine Loras Grieshop, MS, OTR/L   Olegario MessierElaine Carolene Gitto, MS, OTR/L 01/11/2017, 3:46 PM

## 2017-01-11 NOTE — Care Management Important Message (Signed)
Important Message  Patient Details  Name: Curtis Nunez MRN: [id number] Date of Birth: [birth year]   Medicare Important Message Given:  Yes    Marily MemosLisa M Naylani Bradner, RN 01/11/2017, 1:29 PM

## 2017-01-11 NOTE — H&P (Signed)
Called report to Fil[facility]Shanna [facility]. Answered all questions. Called EMS for transport.

## 2017-01-11 NOTE — Discharge Summary (Signed)
[facility] [place] [facility]   PATIENT NAME: Curtis Nunez    MR#:  [id number]  DATE OF BIRTH:  10-Feb-1927  DATE OF ADMISSION:  01/07/2017 ADMITTING PHYSICIAN: Oralia Manis, MD  DATE OF DISCHARGE: 01/11/2017  PRIMARY CARE PHYSICIAN: Marina Goodell, MD    ADMISSION DIAGNOSIS:  Fall [W19.XXXA] Right hip pain [M25.551] Dislocated hip, right, initial encounter (HCC) [S73.004A]  DISCHARGE DIAGNOSIS:  Principal Problem:   Dislocation of right hip (HCC) Active Problems:   HTN (hypertension)   HLD (hyperlipidemia)   Gout   Pressure injury of skin   Right hip pain   SECONDARY DIAGNOSIS:   Past Medical History:  Diagnosis Date  . BPH (benign prostatic hyperplasia)   . Dementia   . Gout   . HLD (hyperlipidemia)   . Hypertension     HOSPITAL COURSE:   83 year old male with past medical history of dementia, recent right hip displacement, gout, hypertension, hyperlipidemia, BPH who presented to the hospital due to right hip pain and noted to have a right hip dislocation.  1. Right hip dislocation-this was the cause of patient's worsening right lower extremity pain. -Seen by orthopedic surgery and status post right hip hemiarthroplasty revision POD # 3. - pt. Is tolerating PT and his pain is controlled with oral meds and he is being discharged to SNF for ongoing care. - pt. Will cont. Lovenox for DVT prophylaxis.  Pt. Will follow up with Orthopedics in a few weeks.     2. Acute blood loss Anemia - this was due to pt's hip surgery. Pt. Was transfused a total of 2 units and hg. Has improved post-transfusion and is currently. Pt. Will cont. Iron supplements.   3. Leukocytosis-mild. There was No evidence of infectious source. His Urinalysis negative, chest x-ray (-) for any infection/pneumonia. - this has resolved.  3. History of gout-no acute attack.  pt. Will Continue allopurinol.  4. Dementia - pt. Has some delirium and sundowning due to narcotics  which have been discontinued now.  - mental status has improved and close to baseline.   DISCHARGE CONDITIONS:   Full code  CONSULTS OBTAINED:  Treatment Team:  Melodye Ped, DO Hooten, Illene Labrador, MD  DRUG ALLERGIES:  No Known Allergies  DISCHARGE MEDICATIONS:   Allergies as of 01/11/2017   No Known Allergies     Medication List    TAKE these medications   acetaminophen 325 MG tablet Commonly known as:  TYLENOL Take 2 tablets (650 mg total) by mouth every 6 (six) hours as needed for mild pain (or Fever >/= 101).   allopurinol 100 MG tablet Commonly known as:  ZYLOPRIM Take by mouth.   aspirin EC 81 MG tablet Take 81 mg by mouth daily.   COCONUT OIL PO Take 1 tablet by mouth daily.   enoxaparin 40 MG/0.4ML injection Commonly known as:  LOVENOX Inject 0.4 mLs (40 mg total) into the skin daily.   ferrous sulfate 325 (65 FE) MG EC tablet Take 325 mg by mouth daily.   FISH OIL OMEGA-3 PO Take 1 tablet by mouth daily.   senna 8.6 MG Tabs tablet Commonly known as:  SENOKOT Take 1 tablet by mouth 2 (two) times daily.         DISCHARGE INSTRUCTIONS:   DIET:  Regular diet  Diet at Discharge recommendation:  Dysphagia level 1 (puree) w/ gravy added to moisten; Thin Liquids.  Aspiration precautions; Pills in Puree - Crushed as able.  Feeding Support -  reduce distractions at meals.  DISCHARGE CONDITION:  Stable  ACTIVITY:  Activity as tolerated  OXYGEN:  Home Oxygen: No.   Oxygen Delivery: room air  DISCHARGE LOCATION:  nursing home   If you experience worsening of your admission symptoms, develop shortness of breath, life threatening emergency, suicidal or homicidal thoughts you must seek medical attention immediately by calling 911 or calling your MD immediately  if symptoms less severe.  You Must read complete instructions/literature along with all the possible adverse reactions/side effects for all the Medicines you take and that have been  prescribed to you. Take any new Medicines after you have completely understood and accpet all the possible adverse reactions/side effects.   Please note  You were cared for by a hospitalist during your hospital stay. If you have any questions about your discharge medications or the care you received while you were in the hospital after you are discharged, you can call the unit and asked to speak with the hospitalist on call if the hospitalist that took care of you is not available. Once you are discharged, your primary care physician will handle any further medical issues. Please note that NO REFILLS for any discharge medications will be authorized once you are discharged, as it is imperative that you return to your primary care physician (or establish a relationship with a primary care physician if you do not have one) for your aftercare needs so that they can reassess your need for medications and monitor your lab values.     Today   No acute events overnight. Constipated and will give stool softeners' today.   VITAL SIGNS:  Blood pressure 127/60, pulse 66, temperature 98.6 F (37 C), temperature source Oral, resp. rate 18, height 5\' 6"  (1.676 m), weight 58.2 kg (128 lb 3.2 oz), SpO2 94 %.  I/O:   Intake/Output Summary (Last 24 hours) at 01/11/17 1230 Last data filed at 01/10/17 1820  Gross per 24 hour  Intake             1170 ml  Output                0 ml  Net             1170 ml    PHYSICAL EXAMINATION:   GENERAL:  83 y.o.-year-old patient lying in bed confused but in NAD.   EYES: Pupils equal, round, reactive to light and accommodation. No scleral icterus. Extraocular muscles intact.  HEENT: Head atraumatic, normocephalic. Oropharynx and nasopharynx clear.  NECK:  Supple, no jugular venous distention. No thyroid enlargement, no tenderness.  LUNGS: Normal breath sounds bilaterally, no wheezing, rales, rhonchi. No use of accessory muscles of respiration.  CARDIOVASCULAR: S1, S2  normal. No murmurs, rubs, or gallops.  ABDOMEN: Soft, nontender, nondistended. Bowel sounds present. No organomegaly or mass.  EXTREMITIES: No cyanosis, clubbing or edema b/l.   Dressing on Right hip from surgery yesterday.   NEUROLOGIC: Cranial nerves II through XII are intact. No focal Motor or sensory deficits b/l.  Globally weak.  PSYCHIATRIC: The patient is alert and oriented x 1.  SKIN: No obvious rash, lesion, or ulcer.   DATA REVIEW:   CBC  Recent Labs Lab 01/11/17 0402  WBC 8.4  HGB 9.4*  HCT 27.2*  PLT 254    Chemistries   Recent Labs Lab 01/07/17 1604  01/10/17 0551  NA 136  < > 134*  K 4.6  < > 4.4  CL 106  < > 108  CO2 23  < > 20*  GLUCOSE 104*  < > 91  BUN 36*  < > 26*  CREATININE 1.03  < > 1.24  CALCIUM 8.0*  < > 7.2*  AST 71*  --   --   ALT 48  --   --   ALKPHOS 123  --   --   BILITOT 0.5  --   --   < > = values in this interval not displayed.  Cardiac Enzymes No results for input(s): TROPONINI in the last 168 hours.  Microbiology Results  Results for orders placed or performed during the hospital encounter of 01/07/17  MRSA PCR Screening     Status: None   Collection Time: 01/07/17 11:25 PM  Result Value Ref Range Status   MRSA by PCR NEGATIVE NEGATIVE Final    Comment:        The GeneXpert MRSA Assay (FDA approved for NASAL specimens only), is one component of a comprehensive MRSA colonization surveillance program. It is not intended to diagnose MRSA infection nor to guide or monitor treatment for MRSA infections.     RADIOLOGY:  No results found.    Management plans discussed with the patient, family and they are in agreement.  CODE STATUS:     Code Status Orders        Start     Ordered   01/07/17 2242  Full code  Continuous     01/07/17 2241      TOTAL TIME TAKING CARE OF THIS PATIENT: 40 minutes.    Houston Siren M.D on 01/11/2017 at 12:30 PM  Between 7am to 6pm - Pager - [phone]  After 6pm go to  [email or web address] - Social research officer, government  [facility] [place] [facility]  Office  [phone]  CC: Primary care physician; Marina Goodell, MD

## 2017-01-11 NOTE — Progress Notes (Signed)
Clinical Social Worker (CSW) contacted patient's wife Claris GowerCharlotte and made her aware that [facility] has approved SNF, auth # [id number]. Wife reported that she would like for patient to D/C to [facility]. [facility] when medically stable.   [employer]Bailey Mikalia Fessel, LCSW [phone]

## 2017-01-13 DIAGNOSIS — F0390 Unspecified dementia without behavioral disturbance: Secondary | ICD-10-CM | POA: Diagnosis not present

## 2017-01-13 DIAGNOSIS — I10 Essential (primary) hypertension: Secondary | ICD-10-CM | POA: Diagnosis not present

## 2017-01-13 DIAGNOSIS — S90822A Blister (nonthermal), left foot, initial encounter: Secondary | ICD-10-CM | POA: Diagnosis not present

## 2017-01-13 DIAGNOSIS — L89610 Pressure ulcer of right heel, unstageable: Secondary | ICD-10-CM | POA: Diagnosis not present

## 2017-01-18 DIAGNOSIS — N401 Enlarged prostate with lower urinary tract symptoms: Secondary | ICD-10-CM | POA: Diagnosis not present

## 2017-01-18 DIAGNOSIS — E785 Hyperlipidemia, unspecified: Secondary | ICD-10-CM | POA: Diagnosis not present

## 2017-01-18 DIAGNOSIS — D509 Iron deficiency anemia, unspecified: Secondary | ICD-10-CM | POA: Diagnosis not present

## 2017-01-18 DIAGNOSIS — M109 Gout, unspecified: Secondary | ICD-10-CM | POA: Diagnosis not present

## 2017-01-18 DIAGNOSIS — I10 Essential (primary) hypertension: Secondary | ICD-10-CM | POA: Diagnosis not present

## 2017-01-18 DIAGNOSIS — S73004D Unspecified dislocation of right hip, subsequent encounter: Secondary | ICD-10-CM | POA: Diagnosis not present

## 2017-01-18 DIAGNOSIS — F0390 Unspecified dementia without behavioral disturbance: Secondary | ICD-10-CM | POA: Diagnosis not present

## 2017-01-19 ENCOUNTER — Other Ambulatory Visit: Payer: Self-pay | Admitting: *Deleted

## 2017-01-19 ENCOUNTER — Other Ambulatory Visit: Payer: Self-pay

## 2017-01-19 NOTE — Patient Outreach (Signed)
  [facility](THN) Care Management  01/19/2017  Curtis Nunez Sep 23, 1926 [id number]   Telephone Screen  Referral Date: 01/19/17 Referral Source: [facility]-UM Dept-Urgent Referral Referral Reason: "member with severe dementia and total care s/p fall with hip fx and ORIF, suffered dislocation and required revision of ORIF, wife is [phone], member leaving SNF 01/19/17 due to SNF co pays"  Insurance: HTA   Outreach attempt # 1 to patient. Spoke with spouse due to patient's documented dementia status and limited ability to communicate. Screening call completed.    Social: Spouse states that patient resides in the home with her along with their great-grand dtr. Patient's grand dtr is also going to be staying intermittently to help out with his care. Patient is dependent with ADLs/IADLs. HE has suffered two falls within the past three months.He is currently unable to walk and is wheelchair bound. DME in the home include wheelchair and hospital bed.   Conditions: Patient has PMH of HTN, HLD, Gout, BPH and dementia. He sustained a fall last month and broke his right hip. He underwent ORIF surgery then was discharged from hospital to [facility] per spouse report. She states that while he was there he sustained another fall and was readmitted back to the hospital to have revision to his previous surgery. After discharge, he was [facility]. Patient is just returning home today. Spouse is trying to get adjusted to providing care for patient. She voices that prior to these admissions she was assisting him with some of his needs but now he requires total care.    Medications: Per spouse patient only taking five meds. Sh denies any issues with affording and/or managing his meds.    Appointments: Patient has f/u app[facility] on [facility] Dr. Ernest PineHooten per spouse report. She has not yet made PCP f/u appt but has been advised to do so. Spouse states that they will be unable to  transport patient to appt now that he is unable to walk.    Advance Directives: Spouse states that she and her dtr have "guardianship paperwork" over patient.   Consent: [facility] reviewed and discussed. Spouse gave verbal consent for services.    Plan: RN CM will notify [facility] administrative assistant of case status. RN CM will send referral to [facility] Community RN for further in home eval/assessment of care needs and management of chronic conditions. RN CM will send [facility] SW referral for possible assistance with  resources related to transportation and in home support to alleviate caregiver burnout/fatigue.   Antionette Fairyoshanda Ziara Thelander, RN,BSN,CCM [facility] Care Management Telephonic Care Management Coordinator Direct Phone: [phone] Toll Free: [phone] Fax: [phone]

## 2017-01-20 ENCOUNTER — Other Ambulatory Visit: Payer: Self-pay | Admitting: *Deleted

## 2017-01-20 NOTE — Patient Outreach (Addendum)
[facility](THN) Care Management  01/20/2017  Curtis HiltsRalph T Nunez 12-18-1926 [id number]   Patient referred to this social worker by the telephonic RNCM to assist patient with transportation resources and in home support.  This Child psychotherapistsocial worker spoke with patient's spouse who explained that patient had fallen getting up form the table a broke his right hip.Marland Kitchen. He had surgery on his hip and was discharged to [facility] for rehab where he sustained another fall and was re-hospitalized.  Patient was then [facility] to [facility] his rehab. Per patient's spouse, he was discharged home from there today. Per member, he was in his co-pay days and they could not afford it.  There was no HH ordered. Per patient's spouse, he is total care. He cannot feed himself or get out of bed. They are working on getting a Nurse, adulthoyer lift for him and have already contacted the insurance company to discuss cost and in [place] providers. Per patient's spouse, they were given a hoyer lift from a friend but the lift was too wide  to fit through their bedroom door. [facility] care [facility] program  discussed, however patient's spouse declined both social work and nursing services through [facility] stating that they her family members have committed to providing care. Patient's spouse explained that she resides with her granddaughter and her husband who help to provide care. She also has another granddaughter who has medical training nd has agreed to assist daily with care and will be staying overnight as well. She also explained that she has another grandson that lives close by and is available to assist.  Patient's spouse did request contact information for transportation. Contact information for [employer] provided [phone] and [employer] transportation [phone].   This Child psychotherapistsocial worker provided patient's spouse with my contact information and encouraged her to call if there are any  additional questions or concerns that I can assist with. Patient's spouse appreciative of information provided.   Plan: This Child psychotherapistsocial worker to contact [facility] to confirm [facility] orders.           Patient to be closed to [facility] care management.           RNCM to be notified.  Adriana ReamsChrystal Docia Klar, LCSW [facility] Care Management [phone]

## 2017-01-20 NOTE — Patient Outreach (Addendum)
[facility]N) Care Management [facility] Community CM Telephone Outreach, Care Coordination  01/20/2017  Brain HiltsRalph T Trumbo [birth year] [id number]  Successful telephone outreach to Drummondhrystal Land, [facility] CSW [phone]) regarding referral placed for [facility] Commuity CM by [facility] telephonic RN after requested screening phone call from patient's insurance company was completed.  Noted through review of EMR that patient's spouse had refused [facility] CCM services 01/19/17 during [facility] CSW outreach; contacted Chrystal today, who confirmed that patient's spouse declined [facility] CM services for both CSW and nursing yesterday, stating that patient's family had everything in place to care for patient, and that family members, which included a nurse, were staying with patient at all times.  Chrystal confirmed that she had spoken to Verdie DrownMary Niemczura, [facility] PAC in follow up to her phone call to patient, and that Corrie DandyMary also confirmed that patient's spouse had also refused [facility] CM services while [facility].  Plan:  Will update patient's primary [facility] RN CM that patient's family has declined [facility] CM services.  Caryl PinaLaine Mckinney Mathieu Schloemer, RN, BSN, [employer] Community Care Coordinator [facility] Care Management  [phone]

## 2017-01-20 NOTE — Patient Outreach (Signed)
[facility](THN) Care Management  01/20/2017  Brain HiltsRalph T Remmert 04-10-1927 [id number]   [facility] call [facility]. Beth confirmed that [facility] services were offered to family through [facility], an agency that the family had used in the past, however patient's spouse declined HH stating that they had family in the home with medical training that could provide care for patient.  Beth was also told  By the family that they had DME already in place and did not need any additional equipment.. Per Remonia RichterBeth, [facility] is willing to reach out patient and his spouse to offer [facility] services again specifically, in regards to DME needs.    Adriana ReamsChrystal Twila Rappa, LCSW [facility] Care Management [phone]

## 2017-01-25 DIAGNOSIS — Z96641 Presence of right artificial hip joint: Secondary | ICD-10-CM | POA: Diagnosis not present

## 2017-01-25 DIAGNOSIS — M25551 Pain in right hip: Secondary | ICD-10-CM | POA: Diagnosis not present

## 2017-01-26 DIAGNOSIS — R112 Nausea with vomiting, unspecified: Secondary | ICD-10-CM | POA: Diagnosis not present

## 2017-01-27 ENCOUNTER — Inpatient Hospital Stay
Admission: EM | Admit: 2017-01-27 | Discharge: 2017-01-30 | DRG: 391 | Disposition: A | Discharge status: Hospice | Payer: PPO | Attending: Internal Medicine | Admitting: Internal Medicine

## 2017-01-27 DIAGNOSIS — Z7189 Other specified counseling: Secondary | ICD-10-CM

## 2017-01-27 DIAGNOSIS — F0390 Unspecified dementia without behavioral disturbance: Secondary | ICD-10-CM | POA: Diagnosis not present

## 2017-01-27 DIAGNOSIS — L89513 Pressure ulcer of right ankle, stage 3: Secondary | ICD-10-CM | POA: Diagnosis present

## 2017-01-27 DIAGNOSIS — Z823 Family history of stroke: Secondary | ICD-10-CM | POA: Diagnosis not present

## 2017-01-27 DIAGNOSIS — M25559 Pain in unspecified hip: Secondary | ICD-10-CM | POA: Diagnosis not present

## 2017-01-27 DIAGNOSIS — Z7401 Bed confinement status: Secondary | ICD-10-CM | POA: Diagnosis not present

## 2017-01-27 DIAGNOSIS — E46 Unspecified protein-calorie malnutrition: Secondary | ICD-10-CM | POA: Diagnosis not present

## 2017-01-27 DIAGNOSIS — Z79899 Other long term (current) drug therapy: Secondary | ICD-10-CM

## 2017-01-27 DIAGNOSIS — L89620 Pressure ulcer of left heel, unstageable: Secondary | ICD-10-CM | POA: Diagnosis present

## 2017-01-27 DIAGNOSIS — E43 Unspecified severe protein-calorie malnutrition: Secondary | ICD-10-CM | POA: Diagnosis not present

## 2017-01-27 DIAGNOSIS — E785 Hyperlipidemia, unspecified: Secondary | ICD-10-CM | POA: Diagnosis not present

## 2017-01-27 DIAGNOSIS — K2900 Acute gastritis without bleeding: Principal | ICD-10-CM | POA: Diagnosis present

## 2017-01-27 DIAGNOSIS — Z8249 Family history of ischemic heart disease and other diseases of the circulatory system: Secondary | ICD-10-CM

## 2017-01-27 DIAGNOSIS — K920 Hematemesis: Secondary | ICD-10-CM | POA: Diagnosis not present

## 2017-01-27 DIAGNOSIS — Z87891 Personal history of nicotine dependence: Secondary | ICD-10-CM

## 2017-01-27 DIAGNOSIS — I10 Essential (primary) hypertension: Secondary | ICD-10-CM | POA: Diagnosis not present

## 2017-01-27 DIAGNOSIS — L89610 Pressure ulcer of right heel, unstageable: Secondary | ICD-10-CM | POA: Diagnosis not present

## 2017-01-27 DIAGNOSIS — Z96641 Presence of right artificial hip joint: Secondary | ICD-10-CM | POA: Diagnosis present

## 2017-01-27 DIAGNOSIS — Z515 Encounter for palliative care: Secondary | ICD-10-CM

## 2017-01-27 DIAGNOSIS — K922 Gastrointestinal hemorrhage, unspecified: Secondary | ICD-10-CM | POA: Diagnosis not present

## 2017-01-27 DIAGNOSIS — L89150 Pressure ulcer of sacral region, unstageable: Secondary | ICD-10-CM | POA: Diagnosis not present

## 2017-01-27 DIAGNOSIS — M109 Gout, unspecified: Secondary | ICD-10-CM | POA: Diagnosis not present

## 2017-01-27 DIAGNOSIS — N400 Enlarged prostate without lower urinary tract symptoms: Secondary | ICD-10-CM | POA: Diagnosis not present

## 2017-01-27 DIAGNOSIS — K921 Melena: Secondary | ICD-10-CM | POA: Diagnosis not present

## 2017-01-27 DIAGNOSIS — E860 Dehydration: Secondary | ICD-10-CM | POA: Diagnosis present

## 2017-01-27 DIAGNOSIS — R630 Anorexia: Secondary | ICD-10-CM

## 2017-01-27 DIAGNOSIS — Z681 Body mass index (BMI) 19 or less, adult: Secondary | ICD-10-CM | POA: Diagnosis not present

## 2017-01-27 DIAGNOSIS — Z7982 Long term (current) use of aspirin: Secondary | ICD-10-CM

## 2017-01-27 LAB — URINALYSIS, ROUTINE W REFLEX MICROSCOPIC
Bilirubin Urine: NEGATIVE
GLUCOSE, UA: NEGATIVE mg/dL
Hgb urine dipstick: NEGATIVE
KETONES UR: NEGATIVE mg/dL
Leukocytes, UA: NEGATIVE
Nitrite: NEGATIVE
PH: 5 (ref 5.0–8.0)
Protein, ur: NEGATIVE mg/dL
Specific Gravity, Urine: 1.028 (ref 1.005–1.030)

## 2017-01-27 LAB — CBC WITH DIFFERENTIAL/PLATELET
BASOS PCT: 0 %
Basophils Absolute: 0 10*3/uL (ref 0–0.1)
EOS ABS: 0 10*3/uL (ref 0–0.7)
Eosinophils Relative: 0 %
HEMATOCRIT: 40 % (ref 40.0–52.0)
Hemoglobin: 13.2 g/dL (ref 13.0–18.0)
LYMPHS ABS: 1.7 10*3/uL (ref 1.0–3.6)
Lymphocytes Relative: 14 %
MCH: 30.2 pg (ref 26.0–34.0)
MCHC: 33.1 g/dL (ref 32.0–36.0)
MCV: 91.4 fL (ref 80.0–100.0)
MONO ABS: 0.7 10*3/uL (ref 0.2–1.0)
MONOS PCT: 6 %
NEUTROS ABS: 10.3 10*3/uL — AB (ref 1.4–6.5)
Neutrophils Relative %: 80 %
Platelets: 288 10*3/uL (ref 150–440)
RBC: 4.38 MIL/uL — ABNORMAL LOW (ref 4.40–5.90)
RDW: 15.6 % — AB (ref 11.5–14.5)
WBC: 12.8 10*3/uL — ABNORMAL HIGH (ref 3.8–10.6)

## 2017-01-27 LAB — COMPREHENSIVE METABOLIC PANEL
ALBUMIN: 3 g/dL — AB (ref 3.5–5.0)
ALT: 13 U/L — ABNORMAL LOW (ref 17–63)
ANION GAP: 7 (ref 5–15)
AST: 21 U/L (ref 15–41)
Alkaline Phosphatase: 129 U/L — ABNORMAL HIGH (ref 38–126)
BILIRUBIN TOTAL: 0.7 mg/dL (ref 0.3–1.2)
BUN: 43 mg/dL — ABNORMAL HIGH (ref 6–20)
CALCIUM: 8.4 mg/dL — AB (ref 8.9–10.3)
CO2: 23 mmol/L (ref 22–32)
Chloride: 111 mmol/L (ref 101–111)
Creatinine, Ser: 1.28 mg/dL — ABNORMAL HIGH (ref 0.61–1.24)
GFR calc non Af Amer: 48 mL/min — ABNORMAL LOW (ref >60)
GFR, EST AFRICAN AMERICAN: 56 mL/min — AB (ref >60)
GLUCOSE: 151 mg/dL — AB (ref 65–99)
POTASSIUM: 4.8 mmol/L (ref 3.5–5.1)
Sodium: 141 mmol/L (ref 135–145)
TOTAL PROTEIN: 6.8 g/dL (ref 6.5–8.1)

## 2017-01-27 LAB — BASIC METABOLIC PANEL
Anion gap: 9 (ref 5–15)
BUN: 37 mg/dL — AB (ref 6–20)
CO2: 20 mmol/L — ABNORMAL LOW (ref 22–32)
CREATININE: 1.06 mg/dL (ref 0.61–1.24)
Calcium: 8 mg/dL — ABNORMAL LOW (ref 8.9–10.3)
Chloride: 113 mmol/L — ABNORMAL HIGH (ref 101–111)
GFR calc Af Amer: >60 mL/min (ref >60)
GLUCOSE: 123 mg/dL — AB (ref 65–99)
POTASSIUM: 4.4 mmol/L (ref 3.5–5.1)
SODIUM: 142 mmol/L (ref 135–145)

## 2017-01-27 LAB — HEMOGLOBIN AND HEMATOCRIT, BLOOD
HCT: 36.3 % — ABNORMAL LOW (ref 40.0–52.0)
HEMATOCRIT: 35.2 % — AB (ref 40.0–52.0)
HEMATOCRIT: 33.3 % — AB (ref 40.0–52.0)
HEMATOCRIT: 31.8 % — AB (ref 40.0–52.0)
HEMOGLOBIN: 11.1 g/dL — AB (ref 13.0–18.0)
HEMOGLOBIN: 10.7 g/dL — AB (ref 13.0–18.0)
Hemoglobin: 12 g/dL — ABNORMAL LOW (ref 13.0–18.0)
Hemoglobin: 11.6 g/dL — ABNORMAL LOW (ref 13.0–18.0)

## 2017-01-27 LAB — PROTIME-INR
INR: 1.17
PROTHROMBIN TIME: 15 s (ref 11.4–15.2)

## 2017-01-27 LAB — APTT: aPTT: 29 seconds (ref 24–36)

## 2017-01-27 LAB — TROPONIN I

## 2017-01-27 LAB — LIPASE, BLOOD: LIPASE: 34 U/L (ref 11–51)

## 2017-01-27 MED ORDER — ADULT MULTIVITAMIN W/MINERALS CH
1.0000 | ORAL_TABLET | Freq: Every day | ORAL | Status: DC
  Administered 2017-01-28 – 2017-01-29 (×2): 1 via ORAL
  Filled 2017-01-27 (×2): qty 1

## 2017-01-27 MED ORDER — PANTOPRAZOLE SODIUM 40 MG IV SOLR
40.0000 mg | Freq: Two times a day (BID) | INTRAVENOUS | Status: DC
  Administered 2017-01-27 – 2017-01-28 (×4): 40 mg via INTRAVENOUS
  Filled 2017-01-27 (×4): qty 40

## 2017-01-27 MED ORDER — SODIUM CHLORIDE 0.9 % IV BOLUS (SEPSIS)
500.0000 mL | INTRAVENOUS | Status: AC
  Administered 2017-01-27: 500 mL via INTRAVENOUS

## 2017-01-27 MED ORDER — SODIUM CHLORIDE 0.9 % IV SOLN
INTRAVENOUS | Status: DC
  Administered 2017-01-27: 06:00:00 via INTRAVENOUS

## 2017-01-27 MED ORDER — SODIUM CHLORIDE 0.9 % IV SOLN
Freq: Once | INTRAVENOUS | Status: AC
  Administered 2017-01-27: 05:00:00 via INTRAVENOUS

## 2017-01-27 MED ORDER — ENSURE ENLIVE PO LIQD
237.0000 mL | Freq: Three times a day (TID) | ORAL | Status: DC
  Administered 2017-01-27 – 2017-01-29 (×6): 237 mL via ORAL

## 2017-01-27 MED ORDER — ONDANSETRON HCL 4 MG/2ML IJ SOLN
4.0000 mg | Freq: Four times a day (QID) | INTRAMUSCULAR | Status: DC | PRN
Start: 2017-01-27 — End: 2017-01-30
  Administered 2017-01-29: 4 mg via INTRAVENOUS
  Filled 2017-01-27: qty 2

## 2017-01-27 MED ORDER — ACETAMINOPHEN 650 MG RE SUPP
650.0000 mg | Freq: Four times a day (QID) | RECTAL | Status: DC | PRN
  Administered 2017-01-29: 650 mg via RECTAL
  Filled 2017-01-27: qty 1

## 2017-01-27 MED ORDER — ONDANSETRON HCL 4 MG PO TABS: 4.0000 mg | ORAL_TABLET | Freq: Four times a day (QID) | ORAL | Status: DC | PRN

## 2017-01-27 MED ORDER — ATENOLOL 25 MG PO TABS
25.0000 mg | ORAL_TABLET | Freq: Every day | ORAL | Status: DC
  Administered 2017-01-28: 25 mg via ORAL
  Filled 2017-01-27: qty 1

## 2017-01-27 MED ORDER — ACETAMINOPHEN 325 MG PO TABS
650.0000 mg | ORAL_TABLET | Freq: Four times a day (QID) | ORAL | Status: DC | PRN
  Administered 2017-01-27: 650 mg via ORAL
  Filled 2017-01-27: qty 2

## 2017-01-27 MED ORDER — ALLOPURINOL 100 MG PO TABS
100.0000 mg | ORAL_TABLET | Freq: Every day | ORAL | Status: DC
  Administered 2017-01-28 – 2017-01-29 (×2): 100 mg via ORAL
  Filled 2017-01-27 (×3): qty 1

## 2017-01-27 NOTE — Consult Note (Signed)
Wyline MoodKiran Duyen Beckom MD, MRCP(U.K) [address]  [address]  [place], [place] [address]  Main: [phone]  Fax: [phone]  Consultation  Referring Provider:     =Dr Luberta MutterKonidena Primary Care Physician:  Marina GoodellFeldpausch, Dale E, MD Primary Gastroenterologist:  None      Reason for Consultation:     GI bleed   Date of Admission:  01/27/2017 Date of Consultation:  01/27/2017         HPI:   Curtis Nunez is a 83 y.o. male who has advanced dementia presented to the ER with nausea, dark brown vomitus and "melanotic stool"Recent Hip surgery . On admission Hb 13.2 grams , when repeated at 11.15 this am was 11.6 grams. Elevated BUN.   All history provided by wife who was at his bedside. The patient is unable to give any history , he needs assistance for all activities of his daily living, needs to consume only soft foods due to episodes of choking .   Per wife he was doing find till yesterday when all of a sudden he had an episode where he threw up and was "dark in color" , she was not sure if it was blood or not. 1 episode of dark colored stool. She did not feel at anytime that he showed any signs of any abdominal discomfort, since coming into the hospital she has not had any new concerns.     Past Medical History:  Diagnosis Date  . BPH (benign prostatic hyperplasia)   . Dementia   . Gout   . HLD (hyperlipidemia)   . Hypertension     Past Surgical History:  Procedure Laterality Date  . HIP ARTHROPLASTY Right 12/16/2016   Procedure: ARTHROPLASTY BIPOLAR HIP (HEMIARTHROPLASTY);  Surgeon: Christena FlakePoggi, John J, MD;  Location: [facility] ORS;  Service: Orthopedics;  Laterality: Right;  . TOTAL HIP REVISION Right 01/08/2017   Procedure: RIGHT HIP HEMIARTHROPLASTY ;  Surgeon: Melodye PedMacDonald, Scott R, DO;  Location: [facility] ORS;  Service: Orthopedics;  Laterality: Right;    Prior to Admission medications   Medication Sig Start Date End Date Taking? Authorizing Provider  acetaminophen (TYLENOL) 325 MG tablet Take 2  tablets (650 mg total) by mouth every 6 (six) hours as needed for mild pain (or Fever >/= 101). 01/11/17  Yes Sainani, Rolly PancakeVivek J, MD  allopurinol (ZYLOPRIM) 100 MG tablet Take 100 mg by mouth daily.  11/16/16  Yes [provider]  aspirin EC 81 MG tablet Take 81 mg by mouth daily.    Yes [provider]  atenolol (TENORMIN) 25 MG tablet Take 25 mg by mouth daily.   Yes [provider]  COCONUT OIL PO Take 1 tablet by mouth daily.   Yes [provider]  Omega-3 Fatty Acids (FISH OIL OMEGA-3 PO) Take 1 tablet by mouth daily.   Yes [provider]  senna (SENOKOT) 8.6 MG TABS tablet Take 1 tablet by mouth 2 (two) times daily.   Yes [provider]  enoxaparin (LOVENOX) 40 MG/0.4ML injection Inject 0.4 mLs (40 mg total) into the skin daily. 01/11/17 01/25/17  Houston SirenSainani, Vivek J, MD    Family History  Problem Relation Age of Onset  . Heart attack Mother   . Brain cancer Sister   . Heart attack Brother   . Stroke Brother      Social History  Substance Use Topics  . Smoking status: Former Smoker    Types: Cigarettes  . Smokeless tobacco: Former NeurosurgeonUser  . Alcohol use No  Allergies as of 01/27/2017  . (No Known Allergies)    Review of Systems:    Patient unable to provide any ROS   Physical Exam:  Vital signs in last 24 hours: Temp:  [96.8 F (36 C)-98.1 F (36.7 C)] 97.4 F (36.3 C) (07/19 1601) Pulse Rate:  [57-72] 57 (07/19 1601) Resp:  [18] 18 (07/19 1601) BP: (97-146)/(39-96) 104/39 (07/19 1601) SpO2:  [93 %-99 %] 97 % (07/19 1601) Weight:  [140 lb (63.5 kg)] 140 lb (63.5 kg) (07/19 0057) Last BM Date: 01/27/17 General:   In bed does not communicate, not in any pain or distress Head:  Normocephalic and atraumatic. Eyes:   No icterus.   Conjunctiva pink. PERRLA. Ears:  Cannot assess Neck:  Supple; no masses or thyroidomegaly Lungs: Respirations even and unlabored. Lungs clear to auscultation bilaterally.   No wheezes, crackles,  or rhonchi.  Heart:  Regular rate and rhythm;  Without murmur, clicks, rubs or gallops Abdomen:  Soft, nondistended, nontender. Normal bowel sounds. No appreciable masses or hepatomegaly.  No rebound or guarding.  Rectal:  Not performed. Msk: unable to assess Extremities:  Without edema, cyanosis or clubbing. Neurologic:  Alert and oriented x0 Skin:  Intact without significant lesions or rashes. Cervical Nodes:  No significant cervical adenopathy. Psych: unable to assess  LAB RESULTS:  Recent Labs  01/27/17 0137 01/27/17 0431 01/27/17 1115  WBC 12.8*  --   --   HGB 13.2 12.0* 11.6*  HCT 40.0 36.3* 35.2*  PLT 288  --   --    BMET  Recent Labs  01/27/17 0137 01/27/17 1115  NA 141 142  K 4.8 4.4  CL 111 113*  CO2 23 20*  GLUCOSE 151* 123*  BUN 43* 37*  CREATININE 1.28* 1.06  CALCIUM 8.4* 8.0*   LFT  Recent Labs  01/27/17 0137  PROT 6.8  ALBUMIN 3.0*  AST 21  ALT 13*  ALKPHOS 129*  BILITOT 0.7   PT/INR  Recent Labs  01/27/17 0345  LABPROT 15.0  INR 1.17    STUDIES: No results found.    Impression / Plan:   Curtis Nunez is a 83 y.o. y/o male presented to the ER with vomiting dark colored vomitus. He has advanced dementia, does not communicate, having issues with swallowing . At this point clinically does not appear he is bleeding . Discussed with wife. She is not keen on any procedures where he will be "put to sleep" and I agreed with her that he is at higher risk from anesthesia related complications .   Plan  1. Watch his CBC if stable start feeding him  2. Continue PPI 3. Endoscopic intervention only if concern for active ongoing bleeding    Thank you for involving me in the care of this patient.      LOS: 0 days   [name], MD  01/27/2017, 4:04 PM

## 2017-01-27 NOTE — H&P (Signed)
[facility] [place] [facility]   PATIENT NAME: Curtis CallasRalph Nunez    MR#:  [id number]  DATE OF BIRTH:  October 01, 1926  DATE OF ADMISSION:  01/27/2017  PRIMARY CARE PHYSICIAN: Marina GoodellFeldpausch, Dale E, MD   REQUESTING/REFERRING PHYSICIAN:   CHIEF COMPLAINT:   Chief Complaint  Patient presents with  . Nausea  . Emesis    HISTORY OF PRESENT ILLNESS: Curtis Nunez  is a 83 y.o. male with a known history of advanced dementia, gout, hyperlipidemia, hypertension, prostate hypertrophy presented to the emergency room with nausea and vomiting. Vomitus was dark brown. Patient also has melanotic stool. Has advanced dementia and unable to give any history. No abdominal pain. Patient was evaluated in the emergency room his stool was heme positive. Hospitalist service was consulted for further care of the patient. Patient is independent in activities of daily living and instrument activities of daily living. Has advanced dementia and unable to give much history. Patient had hip surgery in the past and currently finished dose of Lovenox for DVT prophylaxis. He has traction applied to both lower extremities.  PAST MEDICAL HISTORY:   Past Medical History:  Diagnosis Date  . BPH (benign prostatic hyperplasia)   . Dementia   . Gout   . HLD (hyperlipidemia)   . Hypertension     PAST SURGICAL HISTORY: Past Surgical History:  Procedure Laterality Date  . HIP ARTHROPLASTY Right 12/16/2016   Procedure: ARTHROPLASTY BIPOLAR HIP (HEMIARTHROPLASTY);  Surgeon: Christena FlakePoggi, John J, MD;  Location: [facility] ORS;  Service: Orthopedics;  Laterality: Right;  . TOTAL HIP REVISION Right 01/08/2017   Procedure: RIGHT HIP HEMIARTHROPLASTY ;  Surgeon: Melodye PedMacDonald, Scott R, DO;  Location: [facility] ORS;  Service: Orthopedics;  Laterality: Right;    SOCIAL HISTORY:  Social History  Substance Use Topics  . Smoking status: Former Smoker    Types: Cigarettes  . Smokeless tobacco: Former NeurosurgeonUser  . Alcohol use No    FAMILY  HISTORY:  Family History  Problem Relation Age of Onset  . Heart attack Mother   . Brain cancer Sister   . Heart attack Brother   . Stroke Brother     DRUG ALLERGIES: No Known Allergies  REVIEW OF SYSTEMS:  Could not be obtained secondary to advanced dementia. MEDICATIONS AT HOME:  Prior to Admission medications   Medication Sig Start Date End Date Taking? Authorizing Provider  acetaminophen (TYLENOL) 325 MG tablet Take 2 tablets (650 mg total) by mouth every 6 (six) hours as needed for mild pain (or Fever >/= 101). 01/11/17  Yes Sainani, Rolly PancakeVivek J, MD  allopurinol (ZYLOPRIM) 100 MG tablet Take 100 mg by mouth daily.  11/16/16  Yes [provider]  aspirin EC 81 MG tablet Take 81 mg by mouth daily.    Yes [provider]  atenolol (TENORMIN) 25 MG tablet Take 25 mg by mouth daily.   Yes [provider]  COCONUT OIL PO Take 1 tablet by mouth daily.   Yes [provider]  Omega-3 Fatty Acids (FISH OIL OMEGA-3 PO) Take 1 tablet by mouth daily.   Yes [provider]  senna (SENOKOT) 8.6 MG TABS tablet Take 1 tablet by mouth 2 (two) times daily.   Yes [provider]  enoxaparin (LOVENOX) 40 MG/0.4ML injection Inject 0.4 mLs (40 mg total) into the skin daily. 01/11/17 01/25/17  Houston SirenSainani, Vivek J, MD      PHYSICAL EXAMINATION:   VITAL SIGNS: Blood pressure (!) 143/62, pulse 70, temperature 98.1 F (  36.7 C), temperature source Oral, resp. rate 18, height 5\' 10"  (1.778 m), weight 63.5 kg (140 lb), SpO2 95 %.  GENERAL:  83 y.o.-year-old patient lying in the bed with no acute distress.  EYES: Pupils equal, round, reactive to light and accommodation. No scleral icterus. Extraocular muscles intact.  HEENT: Head atraumatic, normocephalic. Oropharynx dry and nasopharynx clear.  NECK:  Supple, no jugular venous distention. No thyroid enlargement, no tenderness.  LUNGS: Normal breath sounds bilaterally, no wheezing, rales,rhonchi or crepitation. No use  of accessory muscles of respiration.  CARDIOVASCULAR: S1, S2 normal. No murmurs, rubs, or gallops.  ABDOMEN: Soft, nontender, nondistended. Bowel sounds present. No organomegaly or mass.  EXTREMITIES: No pedal edema, cyanosis, or clubbing.  NEUROLOGIC: Cranial nerves II through XII are intact. Muscle strength 5/5 in all extremities. Sensation intact. Gait not checked.  PSYCHIATRIC: could not be assessed. SKIN: No obvious rash, lesion, or ulcer.   LABORATORY PANEL:   CBC  Recent Labs Lab 01/27/17 0137  WBC 12.8*  HGB 13.2  HCT 40.0  PLT 288  MCV 91.4  MCH 30.2  MCHC 33.1  RDW 15.6*  LYMPHSABS 1.7  MONOABS 0.7  EOSABS 0.0  BASOSABS 0.0   ------------------------------------------------------------------------------------------------------------------  Chemistries   Recent Labs Lab 01/27/17 0137  NA 141  K 4.8  CL 111  CO2 23  GLUCOSE 151*  BUN 43*  CREATININE 1.28*  CALCIUM 8.4*  AST 21  ALT 13*  ALKPHOS 129*  BILITOT 0.7   ------------------------------------------------------------------------------------------------------------------ estimated creatinine clearance is 35.8 mL/min (A) (by C-G formula based on SCr of 1.28 mg/dL (H)). ------------------------------------------------------------------------------------------------------------------ No results for input(s): TSH, T4TOTAL, T3FREE, THYROIDAB in the last 72 hours.  Invalid input(s): FREET3   Coagulation profile  Recent Labs Lab 01/27/17 0345  INR 1.17   ------------------------------------------------------------------------------------------------------------------- No results for input(s): DDIMER in the last 72 hours. -------------------------------------------------------------------------------------------------------------------  Cardiac Enzymes  Recent Labs Lab 01/27/17 0137  TROPONINI <0.03    ------------------------------------------------------------------------------------------------------------------ Invalid input(s): POCBNP  ---------------------------------------------------------------------------------------------------------------  Urinalysis    Component Value Date/Time   COLORURINE YELLOW (A) 01/27/2017 0137   APPEARANCEUR CLEAR (A) 01/27/2017 0137   LABSPEC 1.028 01/27/2017 0137   PHURINE 5.0 01/27/2017 0137   GLUCOSEU NEGATIVE 01/27/2017 0137   HGBUR NEGATIVE 01/27/2017 0137   BILIRUBINUR NEGATIVE 01/27/2017 0137   KETONESUR NEGATIVE 01/27/2017 0137   PROTEINUR NEGATIVE 01/27/2017 0137   NITRITE NEGATIVE 01/27/2017 0137   LEUKOCYTESUR NEGATIVE 01/27/2017 0137     RADIOLOGY: No results found.  EKG: Orders placed or performed during the hospital encounter of 01/27/17  . ED EKG  . ED EKG    IMPRESSION AND PLAN: 83 year old elderly male patient with history of dementia, gout, hypertension, prostate hypertrophy presented to the emergency room with nausea vomiting and melanotic stool. Admitting diagnosis 1. Acute gastrointestinal bleeding 2. Dehydration 3. Dementia 4. Hypertension Treatment plan Admit patient to medical floor IV fluid hydration Serial hemoglobin and hematocrit monitoring IV Protonix 40 MG every 12 hourly Gastroenterology consultation DVT prophylaxis sequential compression device to lower extremities  All the records are reviewed and case discussed with ED provider. Management plans discussed with the patient, family and they are in agreement.  CODE STATUS:FULL CODE Code Status History    Date Active Date Inactive Code Status Order ID Comments User Context   01/07/2017 10:41 PM 01/11/2017 11:39 PM Full Code [id number]  Oralia Manis, MD Inpatient   12/16/2016  7:47 PM 12/20/2016  7:24 PM Full Code [id number]  Christena Flake, MD Inpatient   12/16/2016  7:47 PM 12/16/2016  7:47 PM Full Code [id number]  Ramonita Lab, MD Inpatient        TOTAL TIME TAKING CARE OF THIS PATIENT: 50 minutes.    Ihor Austin M.D on 01/27/2017 at 4:35 AM  Between 7am to 6pm - Pager - [phone]  After 6pm go to [email or web address] - password [email or web address] [facility]  [facility]  Office  [phone]  CC: Primary care physician; Marina Goodell, MD

## 2017-01-27 NOTE — ED Notes (Signed)
Pt cleaned up and repositioned in bed. Pt resting comfortably

## 2017-01-27 NOTE — Progress Notes (Signed)
Initial Nutrition Assessment  DOCUMENTATION CODES:   Severe malnutrition in context of chronic illness, Underweight  INTERVENTION:  Provide Ensure Enlive po TID, each supplement provides 350 kcal and 20 grams of protein.   Provide Magic cup TID with meals, each supplement provides 290 kcal and 9 grams of protein.  Provide multivitamin with minerals daily.  Discussed importance of adequate intake of calories and protein to prevent further loss of lean body weight and promote healing of wounds.   NUTRITION DIAGNOSIS:   Malnutrition (Severe) related to chronic illness (hx right hip arthroplasty and revision, dementia) as evidenced by 9.6 percent weight loss over 5 months, severe depletion of body fat, severe depletion of muscle mass.  GOAL:   Patient will meet greater than or equal to 90% of their needs  MONITOR:   PO intake, Supplement acceptance, Labs, Weight trends, I & O's  REASON FOR ASSESSMENT:   Low Braden    ASSESSMENT:   83 year old male with PMHx of dementia, HTN, gout, BPH, HLD, hx of right hip arthroplasty 12/16/2016, hx of right total hip revision 01/08/2017 admitted for N/V, GI bleed, likely due to acute gastritis.   Patient sleeping during assessment, but per wife at bedside he would not be able to give history even if awake in setting of dementia. She reports patient's appetite and intake have decreased since his initial hip surgery in early June. He went to [facility] and started declining. She is not able to report exactly how much he eats at meals. She reports he "chokes" at meals so he is on a soft diet. Reports he eats mashed potatoes and gravy, applesauce, gelatin. Pt currently on dysphagia 1 diet with thin liquids. He drinks two vanilla protein shakes per day, but she is not sure which kind they are. She reports he developed his wounds while at [facility].  UBW was 155 lbs. Limited weight hx in chart. Patient was 136 lbs on 09/01/2016. RD obtained bed  scale weight of 122.9 lbs. He has lost approximately 13.1 lbs (9.6% body weight) over the past 5 months, which is significant for time frame.  Medications reviewed and include: allopurinol, pantoprazole.  Labs reviewed: Chloride 113, CO2 20, BUN 37.   Nutrition-Focused physical exam completed. Findings are severe fat depletion, severe muscle depletion, and no edema.   Diet Order:  DIET - DYS 1 Room service appropriate? Yes; Fluid consistency: Thin  Skin:  Wound (see comment) (Unstageable b/l heels and coccyx, Stg III right heel)  Last BM:  01/27/2017 - type 5  Height:   Ht Readings from Last 1 Encounters:  01/27/17 5\' 10"  (1.778 m)    Weight:   Wt Readings from Last 1 Encounters:  01/27/17 122 lb 14.4 oz (55.7 kg)    Ideal Body Weight:  75.5 kg  BMI:  Body mass index is 17.63 kg/m.  Estimated Nutritional Needs:   Kcal:  1615-1860 (MSJ x 1.3-1.5)  Protein:  84-95 grams (1.5-1.7 grams/kg)  Fluid:  1.4 L/day (25 ml/kg)  EDUCATION NEEDS:   Education needs addressed  Helane RimaLeanne Layne Dilauro, MS, RD, LDN Pager: [phone] After Hours Pager: [phone]

## 2017-01-27 NOTE — Progress Notes (Signed)
Patient has had right hip surgery twice in the past few weeks and must keep wedge between legs at all times. Wedge is from home.

## 2017-01-27 NOTE — ED Triage Notes (Signed)
Pt comes from home via [facility] for c/o of N/V. Per EMS pt has vomited twice today. Pt VS BP-140/70, HR-64, 93% room air.  Pt did have prior hip surgery in June. Pt is alert to self and denies any pain. BS-166. Pt appears in NAD at this time. Respirations even and unlabored.

## 2017-01-27 NOTE — Progress Notes (Addendum)
Pt wife at bedside, reports he ate very well for evening meal. Pt is asleep, resting comfortably. Stool at 5pm was brownish in color, no blood noted. Will continue to monitor.

## 2017-01-27 NOTE — ED Notes (Signed)
If any changes with pt please call Alexa Creed, phone number in chart.

## 2017-01-27 NOTE — Progress Notes (Signed)
Admitted for nausea, vomiting, GI bleed. Patient is very demented, unable to give history. Pertinently had dark brown vomiting, melanotic stool. None since patient is admitted to hospital ,r hemoglobin stable.  Vitals reviewed, labs reviewed. Hemoglobin stable at 12.  #1. Nausea, vomiting likely; due to acute gastritis: Continue IV PPIs, IV Zofran. Next #2 melena likely hemorrhoidal bleed. Patient hemoglobin stable. J consult ordered, doubt if they do any procedure for him continue IV hydration, start patient on diet. #3 dehydration: Continue IV fluids, #4. Recent right hip hemiarthoplasty ,revison . [facility] July 3, discharge from there to home.:  #5 recent acute blood loss anemia after surgery required transfusion. Hemoglobin stable. CODE STATUS full code Physical therapy consult. Time  spent 25 minutes;

## 2017-01-27 NOTE — ED Provider Notes (Signed)
[facility] Emergency Department Provider Note  ____________________________________________    First MD Initiated Contact with Patient 01/27/17 0140     (approximate)  I have reviewed the triage vital signs and the nursing notes.   HISTORY  Chief Complaint Nausea and Emesis  Level 5 caveat:  history/ROS limited by chronic dementia  HPI Curtis Nunez is a 83 y.o. male with advanced dementia who is cared for at home by his great-granddaughter.  He presents by EMS tonight for evaluation of several episodes of vomiting over the last 24 hours.  Additionally the patient's wife and great granddaughter report that he has not been eating or drinking well recently although this is been gradually getting worse over an extended period of time.  He has not been reporting any pain, just the vomiting and not having any appetite.  His family reports that the last episode of emesis was "dark"; when questioned specifically, they said that it "could have been" coffee ground emesis.  The patient does seem to be increasingly weak overall although he is bedbound due to sent orthopedic surgeries on his right hip so that is difficult to assess.  He was not in any acute distress upon arrival and was denying any symptoms although his family describes them as severe.  Nothing in particular makes it better nor worse.  His great-granddaughter reports that at baseline the patient does not know his wife or other family members although he is conversant and generally has a good disposition.   Past Medical History:  Diagnosis Date  . BPH (benign prostatic hyperplasia)   . Dementia   . Gout   . HLD (hyperlipidemia)   . Hypertension     Patient Active Problem List   Diagnosis Date Noted  . GI bleed 01/27/2017  . Pressure injury of skin 01/08/2017  . Right hip pain 01/08/2017  . Dislocation of right hip (HCC) 01/07/2017  . HTN (hypertension) 01/07/2017  . HLD (hyperlipidemia)  01/07/2017  . BPH (benign prostatic hyperplasia) 01/07/2017  . Gout 01/07/2017  . Dementia 01/07/2017  . Hip fracture (HCC) 12/16/2016    Past Surgical History:  Procedure Laterality Date  . HIP ARTHROPLASTY Right 12/16/2016   Procedure: ARTHROPLASTY BIPOLAR HIP (HEMIARTHROPLASTY);  Surgeon: Christena FlakePoggi, John J, MD;  Location: [facility] ORS;  Service: Orthopedics;  Laterality: Right;  . TOTAL HIP REVISION Right 01/08/2017   Procedure: RIGHT HIP HEMIARTHROPLASTY ;  Surgeon: Melodye PedMacDonald, Scott R, DO;  Location: [facility] ORS;  Service: Orthopedics;  Laterality: Right;    Prior to Admission medications   Medication Sig Start Date End Date Taking? Authorizing Provider  acetaminophen (TYLENOL) 325 MG tablet Take 2 tablets (650 mg total) by mouth every 6 (six) hours as needed for mild pain (or Fever >/= 101). 01/11/17  Yes Sainani, Rolly PancakeVivek J, MD  allopurinol (ZYLOPRIM) 100 MG tablet Take 100 mg by mouth daily.  11/16/16  Yes [provider]  aspirin EC 81 MG tablet Take 81 mg by mouth daily.    Yes [provider]  atenolol (TENORMIN) 25 MG tablet Take 25 mg by mouth daily.   Yes [provider]  COCONUT OIL PO Take 1 tablet by mouth daily.   Yes [provider]  Omega-3 Fatty Acids (FISH OIL OMEGA-3 PO) Take 1 tablet by mouth daily.   Yes [provider]  senna (SENOKOT) 8.6 MG TABS tablet Take 1 tablet by mouth 2 (two) times daily.   Yes [provider]  enoxaparin (LOVENOX) 40  MG/0.4ML injection Inject 0.4 mLs (40 mg total) into the skin daily. 01/11/17 01/25/17  Houston Siren, MD    Allergies Patient has no known allergies.  Family History  Problem Relation Age of Onset  . Heart attack Mother   . Brain cancer Sister   . Heart attack Brother   . Stroke Brother     Social History Social History  Substance Use Topics  . Smoking status: Former Smoker    Types: Cigarettes  . Smokeless tobacco: Former Neurosurgeon  . Alcohol use No    Review of Systems Level  5 caveat:  history/ROS limited by chronic dementia  ____________________________________________   PHYSICAL EXAM:  VITAL SIGNS: ED Triage Vitals  Enc Vitals Group     BP 01/27/17 0101 137/81     Pulse Rate 01/27/17 0101 65     Resp 01/27/17 0101 18     Temp 01/27/17 0101 98.1 F (36.7 C)     Temp Source 01/27/17 0101 Oral     SpO2 01/27/17 0101 96 %     Weight 01/27/17 0057 63.5 kg (140 lb)     Height 01/27/17 0057 1.778 m (5\' 10" )     Head Circumference --      [facility] Flow --      Pain Score --      Pain Loc --      Pain Edu? --      Excl. in GC? --     Constitutional: Appears chronically ill but in no acute distress.  Sleeping but awakens to light touch and is conversant. Eyes: Conjunctivae are normal.  Head: Atraumatic. Nose: No congestion/rhinnorhea. Mouth/Throat: Mucous membranes are dry Neck: No stridor.  No meningeal signs.   Cardiovascular: Normal rate, regular rhythm. Good peripheral circulation. Grossly normal heart sounds. Respiratory: Normal respiratory effort.  No retractions. Lungs CTAB. Gastrointestinal: Soft with mild diffuse tenderness throughout.  No distention.  Rectal exam is notable for melena that is strongly heme positive. Musculoskeletal: Recent postoperative surgical wound on his right hip.   Neurologic:  No gross focal neurologic deficits are appreciated.  Skin:  Skin is warm and dry.  Sacral decubitus ulcer is present (see nursing notes for specifics)   ____________________________________________   LABS (all labs ordered are listed, but only abnormal results are displayed)  Labs Reviewed  URINALYSIS, ROUTINE W REFLEX MICROSCOPIC - Abnormal; Notable for the following:       Result Value   Color, Urine YELLOW (*)    APPearance CLEAR (*)    All other components within normal limits  CBC WITH DIFFERENTIAL/PLATELET - Abnormal; Notable for the following:    WBC 12.8 (*)    RBC 4.38 (*)    RDW 15.6 (*)    Neutro Abs 10.3 (*)    All other  components within normal limits  COMPREHENSIVE METABOLIC PANEL - Abnormal; Notable for the following:    Glucose, Bld 151 (*)    BUN 43 (*)    Creatinine, Ser 1.28 (*)    Calcium 8.4 (*)    Albumin 3.0 (*)    ALT 13 (*)    Alkaline Phosphatase 129 (*)    GFR calc non Af Amer 48 (*)    GFR calc Af Amer 56 (*)    All other components within normal limits  HEMOGLOBIN AND HEMATOCRIT, BLOOD - Abnormal; Notable for the following:    Hemoglobin 12.0 (*)    HCT 36.3 (*)    All other components within normal limits  LIPASE, BLOOD  TROPONIN I  PROTIME-INR  APTT  HEMOGLOBIN AND HEMATOCRIT, BLOOD  HEMOGLOBIN AND HEMATOCRIT, BLOOD  HEMOGLOBIN AND HEMATOCRIT, BLOOD  BASIC METABOLIC PANEL   ____________________________________________  EKG  ED ECG REPORT I, Belma Dyches, the attending physician, personally viewed and interpreted this ECG.  Date: 01/27/2017 EKG Time: 04:42 Rate: 66 Rhythm: normal sinus rhythm QRS Axis: normal Intervals: normal w/ LVH ST/T Wave abnormalities: Non-specific ST segment / T-wave changes, but no evidence of acute ischemia.  Inverted T waves in V4-V6   ____________________________________________  RADIOLOGY   No results found.  ____________________________________________   PROCEDURES  Critical Care performed: No   Procedure(s) performed:   Procedures   ____________________________________________   INITIAL IMPRESSION / ASSESSMENT AND PLAN / ED COURSE  Pertinent labs & imaging results that were available during my care of the patient were reviewed by me and considered in my medical decision making (see chart for details).  The patient's H&H are stable but he has melena and no prior history of GI bleeding.  He has not been eating and drinking well recently and he was on Lovenox postoperatively until 2 days ago which may have caused or at least contributed to the current GI bleeding issue.  We have not seen any hematemesis in the  emergency department and he no longer feels nauseated but his family reports dark and possibly coffee ground emesis.  I have given him a fluid bolus and starting an infusion, made him nothing by mouth, and will admit for further management.      ____________________________________________  FINAL CLINICAL IMPRESSION(S) / ED DIAGNOSES  Final diagnoses:  Gastrointestinal hemorrhage with melena  Dementia without behavioral disturbance, unspecified dementia type  Anorexia     MEDICATIONS GIVEN DURING THIS VISIT:  Medications  0.9 %  sodium chloride infusion ( Intravenous Rate/Dose Verify 01/27/17 0630)  acetaminophen (TYLENOL) tablet 650 mg (not administered)    Or  acetaminophen (TYLENOL) suppository 650 mg (not administered)  ondansetron (ZOFRAN) tablet 4 mg (not administered)    Or  ondansetron (ZOFRAN) injection 4 mg (not administered)  pantoprazole (PROTONIX) injection 40 mg (not administered)  sodium chloride 0.9 % bolus 500 mL (0 mLs Intravenous Stopped 01/27/17 0433)  0.9 %  sodium chloride infusion ( Intravenous New Bag/Given 01/27/17 0433)     NEW OUTPATIENT MEDICATIONS STARTED DURING THIS VISIT:  Current Discharge Medication List      Current Discharge Medication List      Current Discharge Medication List       Note:  This document was prepared using Dragon voice recognition software and may include unintentional dictation errors.    Loleta Rose, MD 01/27/17 [phone]

## 2017-01-27 NOTE — ED Notes (Signed)
Family at bedside. Family states pt has been constipated for the past week. He had right hip surgery in June. Per family pt has not been eating nor drinking. Family also states when he vomited it was dark black.

## 2017-01-27 NOTE — Progress Notes (Signed)
Clinical Child psychotherapistocial Worker (CSW) received SNF consult. [facility] had a recent stay there from 01/11/17-01/19/17 and discharged home. [facility]. PT is pending. CSW will continue to follow and assist as needed.   [employer]iley Anaja Monts, LCSW [phone]

## 2017-01-28 DIAGNOSIS — Z7189 Other specified counseling: Secondary | ICD-10-CM

## 2017-01-28 DIAGNOSIS — Z515 Encounter for palliative care: Secondary | ICD-10-CM

## 2017-01-28 DIAGNOSIS — F0390 Unspecified dementia without behavioral disturbance: Secondary | ICD-10-CM

## 2017-01-28 DIAGNOSIS — E46 Unspecified protein-calorie malnutrition: Secondary | ICD-10-CM

## 2017-01-28 LAB — HEMOGLOBIN AND HEMATOCRIT, BLOOD
HCT: 32.4 % — ABNORMAL LOW (ref 40.0–52.0)
HEMOGLOBIN: 10.7 g/dL — AB (ref 13.0–18.0)

## 2017-01-28 NOTE — Care Management (Addendum)
Eric with [facility] here and states family called and is requesting hospice services.  Please update Minerva Areolaric with [facility] when DC date is known @ [phone] Confirm with family that they want hospice services, palliative indicated they were sure family wanted hospice.

## 2017-01-28 NOTE — Progress Notes (Signed)
Noted patient refusing any endoscopic procedures.HB stable  CBC Latest Ref Rng & Units 01/27/2017 01/27/2017 01/27/2017  WBC 3.8 - 10.6 K/uL - - -  Hemoglobin 13.0 - 18.0 g/dL 10.7(L) 11.1(L) 11.6(L)  Hematocrit 40.0 - 52.0 % 31.8(L) 33.3(L) 35.2(L)  Platelets 150 - 440 K/uL - - -   Suggest PPI   I will sign off.  Please call me if any further GI concerns or questions.  We would like to thank you for the opportunity to participate in the care of Curtis Nunez.   Dr Wyline MoodKiran Gwynn Chalker MD,MRCP [facility] Gastro[facility]ology/Hepatology Pager: [phone]

## 2017-01-28 NOTE — Consult Note (Signed)
   [facility]   01/28/2017  [name] Jan 13, 1927 [id number]  Patient screened for potential [facility] Care Management services. [facility] on the [facility] registry as a benefit of their [employer] medicare . Electronic medical record reveals patient's discharge plan is home with hospice services. [facility] Care Management services not appropriate at this time. If patient's post hospital needs change please place a [facility] consult. For questions please contact:   Harnoor Kohles RN, BSN [facility]  Hospital Liaison  [phone]) Business Mobile [phone]) Toll free office

## 2017-01-28 NOTE — Progress Notes (Signed)
[facility]   PATIENT NAME: Curtis Nunez    MR#:  [id number]  DATE OF BIRTH:  [birth year]  SUBJECTIVE: Admitted for nausea, vomiting, melena. Patient is very demented, unable to give history. Nursing reported he had one black stool yesterday. Patient's wife refused any endoscopic interventions as per gastroenterology note. Patient is awake and mumbling most of the time.   CHIEF COMPLAINT:   Chief Complaint  Patient presents with  . Nausea  . Emesis    REVIEW OF SYSTEMS:   Review of Systems  Unable to perform ROS: Dementia   CONSTITUTIONAL: No fever, .   DRUG ALLERGIES:  No Known Allergies  VITALS:  Blood pressure (!) 115/55, pulse 67, temperature 97.8 F (36.6 C), temperature source Oral, resp. rate 18, height 5\' 10"  (1.778 m), weight 55.7 kg (122 lb 14.4 oz), SpO2 96 %.  PHYSICAL EXAMINATION:  GENERAL:  83 y.o.-year-old patient lying in the bed with no acute distress.  EYES: Pupils equal, round, reactive to light  . No scleral icterus. HEENT: Head atraumatic, normocephalic. Oropharynx and nasopharynx clear.  NECK:  Supple, no jugular venous distention. No thyroid enlargement, no tenderness.  LUNGS: Normal breath sounds bilaterally, no wheezing, rales,rhonchi or crepitation. No use of accessory muscles of respiration.  CARDIOVASCULAR: S1, S2 normal. No murmurs, rubs, or gallops.  ABDOMEN: Soft, nontender, nondistended. Bowel sounds present. No organomegaly or mass.  EXTREMITIES: no edema Neuro; patient not able to follow command general dementia so neuro  exam not done.  PSYCHIATRIC: The patient is awake but demented.  SKIN: No obvious rash, lesion, or ulcer.    LABORATORY PANEL:   CBC  Recent Labs Lab 01/27/17 0137  01/27/17 2222  WBC 12.8*  --   --   HGB 13.2  < > 10.7*  HCT 40.0  < > 31.8*  PLT 288  --   --   < > = values in this interval not  displayed. ------------------------------------------------------------------------------------------------------------------  Chemistries   Recent Labs Lab 01/27/17 0137 01/27/17 1115  NA 141 142  K 4.8 4.4  CL 111 113*  CO2 23 20*  GLUCOSE 151* 123*  BUN 43* 37*  CREATININE 1.28* 1.06  CALCIUM 8.4* 8.0*  AST 21  --   ALT 13*  --   ALKPHOS 129*  --   BILITOT 0.7  --    ------------------------------------------------------------------------------------------------------------------  Cardiac Enzymes  Recent Labs Lab 01/27/17 0137  TROPONINI <0.03   ------------------------------------------------------------------------------------------------------------------  RADIOLOGY:  No results found.  EKG:   Orders placed or performed during the hospital encounter of 01/27/17  . ED EKG  . ED EKG  . EKG 12-Lead  . EKG 12-Lead    ASSESSMENT AND PLAN:  #1. Nausea, vomiting, likely due to acute gastritis: Improved, eating well. Continue PPI. #2 possible GI bleed with melena. Patient hemoglobin slightly lower from admission, hemoglobin dropped from 12- to 10.7check hemoglobin today, no indication for blood transfusion today and patient's wife refused endoscopic evaluation. 3. Recent right hip hemiarthroplasty revision: Physical therapy, palliative care consulted.  #4 severe malnutrition: Dietitian recommended ensure en live and Magic cups and multivitamins with minerals     All the records are reviewed and case discussed with Care Management/Social Workerr. Management plans discussed with the patient, family and they are in agreement.  CODE STATUS: full  TOTAL TIME TAKING CARE OF THIS PATIENT:35 minutes.   POSSIBLE D/C IN 1-2 DAYS, DEPENDING ON CLINICAL CONDITION.   Katha Hamming M.D on 01/28/2017 at 8:07  AM  Between 7am to 6pm - Pager - [phone]  After 6pm go to [email or web address] - password [email or web address]  [facility] [place] [facility]  Office   [phone]  CC: Primary care physician; Marina GoodellFeldpausch, Dale E, MD   Note: This dictation was prepared with Dragon dictation along with smaller phrase technology. Any transcriptional errors that result from this process are unintentional.

## 2017-01-28 NOTE — Care Management (Signed)
Wife came by and spoke with CSW and RNCM. She would like to have [facility] in the home at DC.

## 2017-01-28 NOTE — Consult Note (Signed)
Consultation Note Date: 01/28/2017   Patient Name: Curtis Nunez  DOB: 1927/05/15  MRN: [id number]  Age / Sex: 83 y.o., male  PCP: Sofie Hartigan, MD Referring Physician: Epifanio Lesches, MD  Reason for Consultation: Establishing goals of care  HPI/Patient Profile: 83 y.o. male  with past medical history of dementia, hypertension, hyperlipidemia, gout, BPH, and recent fall s/p hip arthroplasty  admitted on 01/27/2017 with nausea, vomiting, and melana. In ED, stool was heme positive. Hemoglobin stable. Evaluated by GI. Started on PPI and monitoring CBC. Wife declines endoscopic interventions at this time. Patient also with advanced dementia and poor oral intake. Palliative medicine consultation for goals of care.   Clinical Assessment and Goals of Care: I have reviewed medical records, discussed with care team, and met with patient and wife [name]) at bedside to discuss diagnosis, prognosis, GOC, EOL wishes, disposition and options. Patient with baseline dementia and unable to participate in the conversation. Resting comfortably during my visit.   Introduced Palliative Medicine as specialized medical care for people living with serious illness. It focuses on providing relief from the symptoms and stress of a serious illness. The goal is to improve quality of life for both the patient and the family.  We discussed a brief life review of the patient. [place]. Married to wife for 82 years with one adult child and multiple grand and great grandchildren. He was diagnosed with dementia 10+ years ago and was fairly independent with ADL's until fall in June. Since the fall and hip surgery, he has not been out of bed and did not do well with physical therapy because of pain. Wife states "the doctor told me he would never walk again." He is no longer able to feed himself and with poor appetite.    Discussed hospital diagnoses and interventions. Educated on the disease trajectory of dementia and high risk for continued functional, cognitive, and nutritional status decline along with risk for recurrent hospitalizations due to dehydration/infection.   Advanced directives, concepts specific to code status, artifical feeding and hydration, and rehospitalization were considered and discussed. Wife tells me her and daughter have joint "legal guardianship." Requested she bring documentation to hospital to be scanned. Asked her thoughts on resuscitation and life support. He used to tell her "we have a chance as long as there is breathe in our body." Educated on medical recommendation against resuscitation due to progressive dementia, age, and frailty. Provided a Hard Choices copy and encouraged she read and discuss with daughter big decisions she will be faced with in the future as his disease continues to progress.    I attempted to elicit values and goals of care important to the patient and wife. It has remained important for her to "keep him out of a nursing home" and "keep him home." She speaks of an agency that her daughter has arranged to be support at home. Also multiple family members and church community that are supportive.   Palliative Care and hospice services outpatient were explained and  offered. Wife states "I have had a bad experience with hospice" and "not interested in hospice." She believes a family members death was hastened due to morphine. Educated on hospice misconception and using medications as needed to control symptoms and relieve suffering. She is also not interested in palliative services following on discharge.   Answered questions and concerns. Therapeutic listening and emotional/spiritual support provided.   **Spoke with Randall Hiss, hospice liaison with [facility]. Daughter, Tye [place], has requested hospice services at home and plans to continue conversations with the patient's wife  regarding hospice and the support they can provide at home.    SUMMARY OF RECOMMENDATIONS    FULL code per wife. Hard Choices copy given. Educated on medical recommendation for DNR/DNI with dementia, age, and frailty.  Daughter requesting hospice services at home on discharge. She has been in contact with [facility]. RN CM aware.   Hospice services will continue GOC/code status conversations with family on discharge.   Code Status/Advance Care Planning:  Full code  Symptom Management:   Per attending  Palliative Prophylaxis:   Aspiration, Bowel Regimen, Delirium Protocol, Oral Care and Turn Reposition  Psycho-social/Spiritual:   Desire for further Chaplaincy support:yes  Additional Recommendations: Caregiving  Support/Resources and Education on Hospice  Prognosis:   < 6 months: if not significantly less with progressing dementia, nutritional, functional, and cognitive status decline, and recent fall s/p hip arthroplasty   Discharge Planning: Home with Hospice      Primary Diagnoses: Present on Admission: . GI bleed   I have reviewed the medical record, interviewed the patient and family, and examined the patient. The following aspects are pertinent.  Past Medical History:  Diagnosis Date  . BPH (benign prostatic hyperplasia)   . Dementia   . Gout   . HLD (hyperlipidemia)   . Hypertension    Social History   Social History  . Marital status: Married    Spouse name: N/A  . Number of children: N/A  . Years of education: N/A   Occupational History  . retired    Social History Main Topics  . Smoking status: Former Smoker    Types: Cigarettes  . Smokeless tobacco: Former Systems developer  . Alcohol use No  . Drug use: No  . Sexual activity: Not Currently   Other Topics Concern  . None   Social History Narrative  . None   Family History  Problem Relation Age of Onset  . Heart attack Mother   . Brain cancer Sister   . Heart attack Brother   . Stroke Brother     Scheduled Meds: . allopurinol  100 mg Oral Daily  . atenolol  25 mg Oral Daily  . feeding supplement (ENSURE ENLIVE)  237 mL Oral TID BM  . multivitamin with minerals  1 tablet Oral Daily  . pantoprazole (PROTONIX) IV  40 mg Intravenous Q12H   Continuous Infusions: PRN Meds:.acetaminophen **OR** acetaminophen, ondansetron **OR** ondansetron (ZOFRAN) IV Medications Prior to Admission:  Prior to Admission medications   Medication Sig Start Date End Date Taking? Authorizing Provider  acetaminophen (TYLENOL) 325 MG tablet Take 2 tablets (650 mg total) by mouth every 6 (six) hours as needed for mild pain (or Fever >/= 101). 01/11/17  Yes Sainani, Belia Heman, MD  allopurinol (ZYLOPRIM) 100 MG tablet Take 100 mg by mouth daily.  11/16/16  Yes [provider]  aspirin EC 81 MG tablet Take 81 mg by mouth daily.    Yes [provider]  atenolol (TENORMIN) 25 MG  tablet Take 25 mg by mouth daily.   Yes [provider]  COCONUT OIL PO Take 1 tablet by mouth daily.   Yes [provider]  Omega-3 Fatty Acids (FISH OIL OMEGA-3 PO) Take 1 tablet by mouth daily.   Yes [provider]  senna (SENOKOT) 8.6 MG TABS tablet Take 1 tablet by mouth 2 (two) times daily.   Yes [provider]  enoxaparin (LOVENOX) 40 MG/0.4ML injection Inject 0.4 mLs (40 mg total) into the skin daily. 01/11/17 01/25/17  Henreitta Leber, MD   No Known Allergies Review of Systems  Unable to perform ROS: Dementia   Physical Exam  Constitutional: He is cooperative.  HENT:  Head: Normocephalic and atraumatic.  Cardiovascular: Regular rhythm.   Pulmonary/Chest: Effort normal and breath sounds normal.  Abdominal: Normal appearance.  Neurological: He is alert. He is disoriented.  Skin: Skin is warm and dry. There is pallor.  Psychiatric: His speech is delayed. Cognition and memory are impaired.  Nursing note and vitals reviewed.  Vital Signs: BP 122/62   Pulse 67   Temp 97.8 F  (36.6 C) (Oral)   Resp 18   Ht 5' 10" (1.778 m)   Wt 55.7 kg (122 lb 14.4 oz) Comment: bed scale  SpO2 96%   BMI 17.63 kg/m  Pain Assessment: PAINAD     SpO2: SpO2: 96 % O2 Device:SpO2: 96 % O2 Flow Rate: .   IO: Intake/output summary:   Intake/Output Summary (Last 24 hours) at 01/28/17 1411 Last data filed at 01/28/17 1200  Gross per 24 hour  Intake              720 ml  Output                0 ml  Net              720 ml    LBM: Last BM Date: 01/28/17 Baseline Weight: Weight: 63.5 kg (140 lb) Most recent weight: Weight: 55.7 kg (122 lb 14.4 oz) (bed scale)     Palliative Assessment/Data: PPS 30%   Flowsheet Rows     Most Recent Value  Intake Tab  Referral Department  Hospitalist  Unit at Time of Referral  Med/Surg Unit  Palliative Care Primary Diagnosis  Neurology  Date Notified  01/28/17  Palliative Care Type  New Palliative care  Reason for referral  Clarify Goals of Care  Date of Admission  01/27/17  Date first seen by Palliative Care  01/28/17  # of days IP prior to Palliative referral  1  Clinical Assessment  Palliative Performance Scale Score  30%  Psychosocial & Spiritual Assessment  Palliative Care Outcomes  Patient/Family meeting held?  Yes  Who was at the meeting?  patient and wife  Palliative Care Outcomes  Clarified goals of care, Counseled regarding hospice, Provided end of life care assistance, Provided psychosocial or spiritual support, ACP counseling assistance      Time In: 1110 Time Out: 1220 Time Total: 30mn Greater than 50%  of this time was spent counseling and coordinating care related to the above assessment and plan.  Signed by:  MIhor Dow FNP-C Palliative Medicine Team  Phone: [phone]: [phone]  Please contact Palliative Medicine Team phone at [phone] questions and concerns.  For individual provider: See AShea Evans

## 2017-01-28 NOTE — Care Management Important Message (Signed)
Important Message  Patient Details  Name: Curtis RudeRalph T Nunez MRN: [id number] Date of Birth: 12/01/1926   Medicare Important Message Given:  Yes    [name], RN 01/28/2017, 9:01 AM

## 2017-01-29 LAB — CBC
HEMATOCRIT: 31.6 % — AB (ref 40.0–52.0)
HEMOGLOBIN: 10.6 g/dL — AB (ref 13.0–18.0)
MCH: 31.2 pg (ref 26.0–34.0)
MCHC: 33.4 g/dL (ref 32.0–36.0)
MCV: 93.2 fL (ref 80.0–100.0)
Platelets: 206 10*3/uL (ref 150–440)
RBC: 3.39 MIL/uL — AB (ref 4.40–5.90)
RDW: 16.1 % — ABNORMAL HIGH (ref 11.5–14.5)
WBC: 9.4 10*3/uL (ref 3.8–10.6)

## 2017-01-29 MED ORDER — PANTOPRAZOLE SODIUM 40 MG PO TBEC
40.0000 mg | DELAYED_RELEASE_TABLET | Freq: Every day | ORAL | Status: DC
  Administered 2017-01-29: 40 mg via ORAL
  Filled 2017-01-29: qty 1

## 2017-01-29 NOTE — Progress Notes (Signed)
[facility] - [place] at [facility]   PATIENT NAME: Coyle Stordahl    MR#:  [id number]  DATE OF BIRTH:  [birth year]  SUBJECTIVE: Admitted for nausea, vomiting, melena. Patient is very demented, unable to give history. Nursing reported he had one black stool yesterday. Patient's wife refused any endoscopic interventions as per gastroenterology note. Spoke with patient's wife. .   CHIEF COMPLAINT:   Chief Complaint  Patient presents with  . Nausea  . Emesis    REVIEW OF SYSTEMS:   Review of Systems  Unable to perform ROS: Dementia   CONSTITUTIONAL: No fever, .   DRUG ALLERGIES:  No Known Allergies  VITALS:  Blood pressure 120/62, pulse 66, temperature 98.9 F (37.2 C), temperature source Oral, resp. rate 18, height 5\' 10"  (1.778 m), weight 55.7 kg (122 lb 14.4 oz), SpO2 98 %.  PHYSICAL EXAMINATION:  GENERAL:  83 y.o.-year-old patient lying in the bed with no acute distress.  EYES: Pupils equal, round, reactive to light  . No scleral icterus. HEENT: Head atraumatic, normocephalic. Oropharynx and nasopharynx clear.  NECK:  Supple, no jugular venous distention. No thyroid enlargement, no tenderness.  LUNGS: Normal breath sounds bilaterally, no wheezing, rales,rhonchi or crepitation. No use of accessory muscles of respiration.  CARDIOVASCULAR: S1, S2 normal. No murmurs, rubs, or gallops.  ABDOMEN: Soft, nontender, nondistended. Bowel sounds present. No organomegaly or mass.  EXTREMITIES: no edema Neuro; patient not able to follow command general dementia so neuro  exam not done.  PSYCHIATRIC: The patient is awake but demented.  SKIN: No obvious rash, lesion, or ulcer.    LABORATORY PANEL:   CBC  Recent Labs Lab 01/29/17 0350  WBC 9.4  HGB 10.6*  HCT 31.6*  PLT 206   ------------------------------------------------------------------------------------------------------------------  Chemistries   Recent Labs Lab 01/27/17 0137 01/27/17 1115   NA 141 142  K 4.8 4.4  CL 111 113*  CO2 23 20*  GLUCOSE 151* 123*  BUN 43* 37*  CREATININE 1.28* 1.06  CALCIUM 8.4* 8.0*  AST 21  --   ALT 13*  --   ALKPHOS 129*  --   BILITOT 0.7  --    ------------------------------------------------------------------------------------------------------------------  Cardiac Enzymes  Recent Labs Lab 01/27/17 0137  TROPONINI <0.03   ------------------------------------------------------------------------------------------------------------------  RADIOLOGY:  No results found.  EKG:   Orders placed or performed during the hospital encounter of 01/27/17  . ED EKG  . ED EKG  . EKG 12-Lead  . EKG 12-Lead    ASSESSMENT AND PLAN:  #1. Nausea, vomiting, likely due to acute gastritis: Improved, eating well. Continue PPI.Change  To PO PPI.  #2 possible GI bleed with melena. Patient hemoglobin stable. No endoscopic intervention as per wife wishes. 3. Recent right hip hemiarthroplasty revision: Physical therapy, palliative care consulted. Wife requesting physical therapy evaluation, she will take her home with home health physical therapy, aid,hoyerlift.  #4 severe malnutrition: Dietitian recommended ensure en live and Magic cups and multivitamins with minerals     All the records are reviewed and case discussed with Care Management/Social Workerr. Management plans discussed with the patient, family and they are in agreement.  CODE STATUS: full  TOTAL TIME TAKING CARE OF THIS PATIENT:35 minutes.   POSSIBLE D/C IN 1-2 DAYS, DEPENDING ON CLINICAL CONDITION.   Katha Hamming M.D on 01/29/2017 at 9:01 AM  Between 7am to 6pm - Pager - [phone]  After 6pm go to [email or web address] - password [email or web address] [facility]  [place]  [facility]  Office  [phone]  CC: Primary  care physician; Marina GoodellFeldpausch, Dale E, MD   Note: This dictation was prepared with Dragon dictation along with smaller phrase technology. Any transcriptional errors  that result from this process are unintentional.

## 2017-01-29 NOTE — Progress Notes (Signed)
Pt resting, drowsy, will awaken to stimulus. Pt nods, gestures appropriately. Spoke with PT, patient is usually more alert around mealtimes, may need to try a different time. Will continue to monitor.

## 2017-01-29 NOTE — Progress Notes (Signed)
PT Cancellation Note  Patient Details Name: Curtis Nunez T Micalizzi MRN: [id number] DOB: 1926/10/24   Cancelled Treatment:    Reason Eval/Treat Not Completed: Fatigue/lethargy limiting ability to participate. PT spoke to RN Verlon Au(Leslie) prior to commencing evaluation. Upon entering room, patient asleep, not awaking with sternal rub. Will check back later when more alert.   Neita CarpJulie Ann Griffyn Kucinski, PT, DPT 01/29/2017, 2:54 PM

## 2017-01-29 NOTE — Progress Notes (Signed)
Pt sleeping, awakened to voice, sitting up. Offered meal tray, pt only eating small bites/sips. Wife has been here for majority of meals, pt eats well when she is present, however she has gone home for the evening. Pt has eaten both breakfast and dinner today, denies pain at this time. Will continue to monitor.

## 2017-01-30 MED ORDER — PANTOPRAZOLE SODIUM 40 MG PO TBEC: 40.0000 mg | DELAYED_RELEASE_TABLET | Freq: Every day | ORAL | 0 refills | Status: AC

## 2017-01-30 MED ORDER — ENSURE ENLIVE PO LIQD: 237.0000 mL | Freq: Three times a day (TID) | ORAL | 12 refills | Status: AC

## 2017-01-30 NOTE — Progress Notes (Signed)
Physical Therapy Evaluation Patient Details Name: Curtis Nunez MRN: [id number] DOB: 1926-08-15 Today's Date: 01/30/2017   History of Present Illness  Patient is an 83 y.o. male admitted on 19 JUL with nausea/emesis. PMH includes R hip hemiarthroplasty and revision in JUN 2018, advanced dementia, gout, HLD, HTN, and prostate hypertrophy.  Clinical Impression  Patient awake but lethargic upon PT arrival today with no family at bedside. Subjective information obtained from previous notes and RN who assisted with bed mobility intermittently during evaluation. Patient on evaluation was able to intermittently follow one step commands to perform bed mobility. Was able to move to EOB with maximal assistance, demonstrating R posterolateral lean. Was able to intermittently engage core musculature with cues, allowing him to sit for a few minutes with assistance. Required +2 assistance to return to supine. Deferred sit to stand transfer secondary to decreased static sitting balance, as well as pressure ulcers on heels. Patient's wife reportedly wants patient to return home with home health. In order for this to safely occur, the patient will require 24 hour assistance, as he required maximal to +2 assistance to complete bed mobility. He will also be in need of hospital bed, hoyer lift, wheelchair, and likely a bed pan. Patient will continue to benefit from progressive PT to improve core strength and improve static balance to improve overall function.    Follow Up Recommendations Home health PT;Supervision/Assistance - 24 hour    Equipment Recommendations  Wheelchair (measurements PT);Wheelchair cushion (measurements PT);Hospital bed;Other (comment) (Hoyer lift, bed pan)    Recommendations for Other Services       Precautions / Restrictions Precautions Precautions: Fall;Posterior Hip Restrictions Weight Bearing Restrictions: No Other Position/Activity Restrictions: Abduction pillow on when not attempting  mobility. Strict posterior hip precautions.       Mobility  Bed Mobility Overal bed mobility: Needs Assistance Bed Mobility: Supine to Sit;Sit to Supine     Supine to sit: Max assist;HOB elevated Sit to supine: +2 for physical assistance;HOB elevated   General bed mobility comments: Patient required maximal assistance to move to EOB. Demonstrated R posterolateral lean upon static balance testing. Required +2 assistance to return to supine.  Transfers                 General transfer comment: Deferred due to pressure ulcers on heels  Ambulation/Gait                Stairs            Wheelchair Mobility    Modified Rankin (Stroke Patients Only)       Balance Overall balance assessment: Needs assistance Sitting-balance support: Bilateral upper extremity supported Sitting balance-Leahy Scale: Poor   Postural control: Right lateral lean;Posterior lean                                   Pertinent Vitals/Pain Pain Assessment: No/denies pain Pain Intervention(s): Limited activity within patient's tolerance;Monitored during session;Repositioned    Home Living Family/patient expects to be discharged to:: Private residence Living Arrangements: Children;Spouse/significant other Available Help at Discharge: Family;Available 24 hours/day Type of Home: House Home Access: Stairs to enter Entrance Stairs-Rails: Right Entrance Stairs-Number of Steps: 3 Home Layout: One level Home Equipment: Walker - 2 wheels      Prior Function Level of Independence: Needs assistance      ADL's / Homemaking Assistance Needed: Independent with self-feeding. WIfe assist with ADL, IADL tasks.  Hand Dominance        Extremity/Trunk Assessment   Upper Extremity Assessment Upper Extremity Assessment: Generalized weakness    Lower Extremity Assessment Lower Extremity Assessment: Generalized weakness;RLE deficits/detail RLE Deficits / Details:  Decreased ROM and strength RLE: Unable to fully assess due to pain       Communication      Cognition Arousal/Alertness: Lethargic Behavior During Therapy: Flat affect Overall Cognitive Status: History of cognitive impairments - at baseline                                 General Comments: Intermittent ability to follow one step commands      General Comments      Exercises Other Exercises Other Exercises: Sitting at EOB x3 minutes   Assessment/Plan    PT Assessment Patient needs continued PT services  PT Problem List Decreased strength;Decreased range of motion;Decreased activity tolerance;Decreased balance;Decreased mobility;Decreased cognition;Decreased knowledge of use of DME;Pain;Decreased skin integrity       PT Treatment Interventions DME instruction;Gait training;Functional mobility training;Therapeutic activities;Therapeutic exercise;Balance training;Patient/family education;Cognitive remediation;Wheelchair mobility training    PT Goals (Current goals can be found in the Care Plan section)  Acute Rehab PT Goals Patient Stated Goal: Unstated PT Goal Formulation: Patient unable to participate in goal setting Time For Goal Achievement: 02/13/17 Potential to Achieve Goals: Poor    Frequency Min 2X/week   Barriers to discharge        Co-evaluation               AM-PAC PT "6 Clicks" Daily Activity  Outcome Measure Difficulty turning over in bed (including adjusting bedclothes, sheets and blankets)?: Total Difficulty moving from lying on back to sitting on the side of the bed? : Total Difficulty sitting down on and standing up from a chair with arms (e.g., wheelchair, bedside commode, etc,.)?: Total Help needed moving to and from a bed to chair (including a wheelchair)?: Total Help needed walking in hospital room?: Total Help needed climbing 3-5 steps with a railing? : Total 6 Click Score: 6    End of Session   Activity Tolerance:  Patient limited by lethargy;Patient limited by pain Patient left: in bed;with call bell/phone within reach;with bed alarm set;with nursing/sitter in room Nurse Communication: Mobility status PT Visit Diagnosis: Muscle weakness (generalized) (M62.81);Difficulty in walking, not elsewhere classified (R26.2);Pain Pain - Right/Left: Right Pain - part of body: Hip    Time: [id number] PT Time Calculation (min) (ACUTE ONLY): 25 min   Charges:   PT Evaluation $PT Eval Moderate Complexity: 1 Procedure     PT G Codes:          Neita CarpJulie Ann Ellizabeth Dacruz, PT, DPT 01/30/2017, 8:54 AM

## 2017-01-30 NOTE — Progress Notes (Signed)
Pt discharged to home with hospice care. Ems transported pt. Family reviewed D/C instructions and medications as pt was not able.

## 2017-01-30 NOTE — Care Management Note (Addendum)
Case Management Note  Patient Details  Name: Curtis Nunez MRN: [id number] Date of Birth: 11-Feb-1927  Subjective/Objective:    Curtis Nunez at [facility] was notified that Curtis Nunez is expected to be discharged home today via EMS. He already has Nunez hospital bed and Nunez hoyer lift in the home. Curtis Nunez reports today that she wants to take him home. [facility] discharge home today.               Action/Plan:   Expected Discharge Date:  01/30/17               Expected Discharge Plan:   01/30/17  In-House Referral:     Discharge planning Services   [facility] to follow in the home.   Post Acute Care Choice:   Yes Choice offered to:   Curtis Nunez  DME Arranged:   NA DME Agency:   NA  HH Arranged:   hospice services HH Agency:   [facility]  Status of Service:   Completed  If discussed at Long Length of Stay Meetings, dates discussed:    Additional Comments:  Curtis Gayman A, RN 01/30/2017, 11:05 AM

## 2017-01-30 NOTE — Progress Notes (Signed)
[facility] [place] [facility]   PATIENT NAME: Curtis Nunez    MR#:  [id number]  DATE OF BIRTH:  01-Aug-1926  SUBJECTIVE she and is seen at bedside, has completed dementia, physical therapy at bedside. Patient is a total care now. Will discuss with wife because she told me yesterday that patient was able to feed himself and was able to sit in the chair but no dementia is worse when needing 100% assistance. Not sure if she can take care of him at home.   CHIEF COMPLAINT:   Chief Complaint  Patient presents with  . Nausea  . Emesis    REVIEW OF SYSTEMS:   Review of Systems  Unable to perform ROS: Dementia   CONSTITUTIONAL: No fever, .   DRUG ALLERGIES:  No Known Allergies  VITALS:  Blood pressure (!) 115/55, pulse 61, temperature (!) 97.1 F (36.2 C), temperature source Axillary, resp. rate 18, height 5\' 10"  (1.778 m), weight 55.7 kg (122 lb 14.4 oz), SpO2 95 %.  PHYSICAL EXAMINATION:  GENERAL:  83 y.o.-year-old patient lying in the bed with no acute distress.  EYES: Pupils equal, round, reactive to light  . No scleral icterus. HEENT: Head atraumatic, normocephalic. Oropharynx and nasopharynx clear.  NECK:  Supple, no jugular venous distention. No thyroid enlargement, no tenderness.  LUNGS: Normal breath sounds bilaterally, no wheezing, rales,rhonchi or crepitation. No use of accessory muscles of respiration.  CARDIOVASCULAR: S1, S2 normal. No murmurs, rubs, or gallops.  ABDOMEN: Soft, nontender, nondistended. Bowel sounds present. No organomegaly or mass.  EXTREMITIES: no edema Neuro; patient not able to follow command general dementia so neuro  exam not done.  PSYCHIATRIC: The patient is awake but demented.  SKIN: No obvious rash, lesion, or ulcer.    LABORATORY PANEL:   CBC  Recent Labs Lab 01/29/17 0350  WBC 9.4  HGB 10.6*  HCT 31.6*  PLT 206    ------------------------------------------------------------------------------------------------------------------  Chemistries   Recent Labs Lab 01/27/17 0137 01/27/17 1115  NA 141 142  K 4.8 4.4  CL 111 113*  CO2 23 20*  GLUCOSE 151* 123*  BUN 43* 37*  CREATININE 1.28* 1.06  CALCIUM 8.4* 8.0*  AST 21  --   ALT 13*  --   ALKPHOS 129*  --   BILITOT 0.7  --    ------------------------------------------------------------------------------------------------------------------  Cardiac Enzymes  Recent Labs Lab 01/27/17 0137  TROPONINI <0.03   ------------------------------------------------------------------------------------------------------------------  RADIOLOGY:  No results found.  EKG:   Orders placed or performed during the hospital encounter of 01/27/17  . ED EKG  . ED EKG  . EKG 12-Lead  . EKG 12-Lead    ASSESSMENT AND PLAN:  #1. Nausea, vomiting, likely due to acute gastritis: Improved, eating well. Continue PPI.Change  To PO PPI.  #2 possible GI bleed with melena. Patient hemoglobin stable. No endoscopic intervention as per wife wishes. 3. Recent right hip hemiarthroplasty revision: Physical therapy, palliative care consulted. Wife requesting physical therapy evaluation, she will take her home with home health physical therapy, aid,hoyerlift. We'll discuss with her again because patient is needing 100% assistance. See if she is considering any SNF placement. #4 severe malnutrition: Dietitian recommended ensure en live and Magic cups and multivitamins with minerals  5.pressure ulcers unstageable ;has heel protectors now    All the records are reviewed and case discussed with Care Management/Social Workerr. Management plans discussed with the patient, family and they are in agreement.  CODE STATUS: full  TOTAL TIME TAKING CARE  OF THIS PATIENT:35 minutes.   POSSIBLE D/C IN 1-2 DAYS, DEPENDING ON CLINICAL CONDITION.   Katha HammingKONIDENA,Cinch Ormond M.D on  01/30/2017 at 8:48 AM  Between 7am to 6pm - Pager - [phone]  After 6pm go to [email or web address] - password [email or web address]  [facility]  Office  [phone]  CC: Primary care physician; Marina GoodellFeldpausch, Dale E, MD   Note: This dictation was prepared with Dragon dictation along with smaller phrase technology. Any transcriptional errors that result from this process are unintentional.

## 2017-02-03 NOTE — Discharge Summary (Signed)
Curtis Nunez, is a 83 y.o. male  DOB Sep 22, 1926  MRN [id number].  Admission date:  01/27/2017  Admitting Physician  Ihor Austin, MD  Discharge Date:  01/30/2017   Primary MD  Marina Goodell, MD  Recommendations for primary care physician for things to follow:   Follow with PCP in 2 weeks.   Admission Diagnosis  Anorexia [R63.0] Gastrointestinal hemorrhage with melena [K92.1] Dementia without behavioral disturbance, unspecified dementia type [F03.90]   Discharge Diagnosis  Anorexia [R63.0] Gastrointestinal hemorrhage with melena [K92.1] Dementia without behavioral disturbance, unspecified dementia type [F03.90]   Active Problems:   GI bleed   Protein-calorie malnutrition (HCC)   Palliative care by specialist   Goals of care, counseling/discussion      Past Medical History:  Diagnosis Date  . BPH (benign prostatic hyperplasia)   . Dementia   . Gout   . HLD (hyperlipidemia)   . Hypertension     Past Surgical History:  Procedure Laterality Date  . HIP ARTHROPLASTY Right 12/16/2016   Procedure: ARTHROPLASTY BIPOLAR HIP (HEMIARTHROPLASTY);  Surgeon: Christena Flake, MD;  Location: [facility] ORS;  Service: Orthopedics;  Laterality: Right;  . TOTAL HIP REVISION Right 01/08/2017   Procedure: RIGHT HIP HEMIARTHROPLASTY ;  Surgeon: Melodye Ped, DO;  Location: [facility] ORS;  Service: Orthopedics;  Laterality: Right;       History of present illness and  Hospital Course:     Kindly see H&P for history of present illness and admission details, please review complete Labs, Consult reports and Test reports for all details in brief  HPI  from the history and physical done on the day of admission 83 yr old male with severe dementia admitted for nausea,dark  Brown vomitinf and melanotic stools and GI  bleed.   Hospital Course  #1. Nausea, vomiting, likely due to acute gastritis: Improved, intially  Received IV PPI but changed to PO PPI as nausea,vomiting resolved..  #2 possible GI bleed with melena. Patient hemoglobin stable. No endoscopic intervention as per wife wishes.seen by GI,and GI signed off.  3. Recent right hip hemiarthroplasty revision: Physical therapy, palliative care has seen the patient., she took himhome with home health physical therapy, aid,hoyerlift. Wife refused SNF placement,  #4 severe malnutrition: Dietitian recommended ensure en live and Magic cups and multivitamins with minerals  5.HTN;controlled 6.Advanced dementia;full code.wife did not want to change code status this time.     Discharge Condition:stable   Follow UP  Follow-up Information    Marina Goodell, MD Follow up in 2 week(s).   Specialty:  Family Medicine Contact information: [address] [place] [address] [phone]             Discharge Instructions  and  Discharge Medications     Allergies as of 01/30/2017   No Known Allergies     Medication List    STOP taking these medications   enoxaparin 40 MG/0.4ML injection Commonly known as:  LOVENOX     TAKE these medications   acetaminophen 325 MG tablet Commonly known as:  TYLENOL Take 2 tablets (650 mg total) by mouth every 6 (six) hours as needed for mild pain (or Fever >/= 101).   allopurinol 100 MG tablet Commonly known as:  ZYLOPRIM Take 100 mg by mouth daily.   aspirin EC 81 MG tablet Take 81 mg by mouth daily.   atenolol 25 MG tablet Commonly known as:  TENORMIN Take 25 mg by mouth daily.   COCONUT OIL  PO Take 1 tablet by mouth daily.   feeding supplement (ENSURE ENLIVE) Liqd Take 237 mLs by mouth 3 (three) times daily between meals.   FISH OIL OMEGA-3 PO Take 1 tablet by mouth daily.   pantoprazole 40 MG tablet Commonly known as:  PROTONIX Take 1 tablet (40 mg total) by mouth daily.    senna 8.6 MG Tabs tablet Commonly known as:  SENOKOT Take 1 tablet by mouth 2 (two) times daily.         Diet and Activity recommendation: See Discharge Instructions above   Consults obtained -GI,PT,Palliative care   Major procedures and Radiology Reports - PLEASE review detailed and final reports for all details, in brief -     Dg Chest 1 View  Result Date: 01/07/2017 CLINICAL DATA:  Crackles in chest EXAM: CHEST 1 VIEW COMPARISON:  December 19, 2016 FINDINGS: No pneumothorax. Cardiomegaly. The hila and mediastinum are normal. There is a skin fold over the lateral right chest with lung markings on both sides. Minimal opacity is seen in this region, possibly atelectasis or something on the patient. No evidence of pneumonia or overt edema. IMPRESSION: No active disease. Electronically Signed   By: Gerome Samavid  Williams III M.D   On: 01/07/2017 17:33   Dg Pelvis 1-2 Views  Result Date: 01/08/2017 CLINICAL DATA:  83 y/o male status post right hip hemiarthroplasty revision. EXAM: PELVIS - 1-2 VIEW COMPARISON:  CT 01/07/2017 and earlier. FINDINGS: Two AP portable views of the pelvis at 1415 hours. Right hip hemiarthroplasty versus bipolar type arthroplasty with intact hardware, and normally located femoral/acetabular component on these AP views. No unexpected osseous changes. Postoperative changes to the surrounding soft tissues including gas and skin staples. Stable pelvis otherwise. Aortoiliac calcified atherosclerosis. IMPRESSION: Right hip arthroplasty revision with no adverse features identified. Electronically Signed   By: Odessa FlemingH  Hall M.D.   On: 01/08/2017 14:31   Ct Pelvis Wo Contrast  Result Date: 01/07/2017 CLINICAL DATA:  83 year old male with acute right hip pain following probable fall. EXAM: CT PELVIS WITHOUT CONTRAST TECHNIQUE: Multidetector CT imaging of the pelvis was performed following the standard protocol without intravenous contrast. COMPARISON:  Radiographs performed today.  FINDINGS: Urinary Tract:  Unremarkable. Bowel:  No definite abnormality.  Appendix normal. Vascular/Lymphatic: Aortic atherosclerotic calcification noted without aneurysm. No enlarged lymph nodes Reproductive:  Prostate enlargement noted Other:  No free fluid focal collection or pneumoperitoneum. Musculoskeletal: Proximal right femoral prosthesis is identified with dislocation of the femoral head component, which located lateral and slightly posterosuperior to the acetabulum. No definite fracture identified. Degenerative changes in the lower lumbar spine noted. IMPRESSION: Dislocation of the right femoral head prosthesis located lateral and slightly posterosuperior to the acetabulum. No definite fracture. Aortic Atherosclerosis (ICD10-I70.0). Electronically Signed   By: Harmon PierJeffrey  Hu M.D.   On: 01/07/2017 21:57   Dg Hip Unilat W Or Wo Pelvis 2-3 Views Right  Result Date: 01/07/2017 CLINICAL DATA:  Dislocation EXAM: DG HIP (WITH OR WITHOUT PELVIS) 2-3V RIGHT COMPARISON:  None. FINDINGS: The patient is status post right hip replacement. The replaced right hip is dislocated superiorly on the frontal view. No fractures are seen. IMPRESSION: Dislocated right hip Electronically Signed   By: Gerome Samavid  Williams III M.D   On: 01/07/2017 17:35    Micro Results    No results found for this or any previous visit (from the past 240 hour(s)).     Today   Subjective:   Curtis Nunez today seen,appears comfortable.has advanced dementia.  Objective:  Blood pressure 120/61, pulse 68, temperature (!) 97.5 F (36.4 C), temperature source Axillary, resp. rate 16, height 5\' 10"  (1.778 m), weight 55.7 kg (122 lb 14.4 oz), SpO2 99 %.  No intake or output data in the 24 hours ending 02/03/17 1005  Exam Awake  Has baseline dementia., No new F.N deficits, Normal affect [place].AT,PERRAL Supple Neck,No JVD, No cervical lymphadenopathy appriciated.  Symmetrical Chest wall movement, Good air movement bilaterally,  CTAB RRR,No Gallops,Rubs or new Murmurs, No Parasternal Heave +ve B.Sounds, Abd Soft, Non tender, No organomegaly appriciated, No rebound -guarding or rigidity. No Cyanosis, Clubbing or edema, No new Rash or bruise  Data Review   CBC w Diff:  Lab Results  Component Value Date   WBC 9.4 01/29/2017   HGB 10.6 (L) 01/29/2017   HCT 31.6 (L) 01/29/2017   PLT 206 01/29/2017   LYMPHOPCT 14 01/27/2017   MONOPCT 6 01/27/2017   EOSPCT 0 01/27/2017   BASOPCT 0 01/27/2017    CMP:  Lab Results  Component Value Date   NA 142 01/27/2017   K 4.4 01/27/2017   CL 113 (H) 01/27/2017   CO2 20 (L) 01/27/2017   BUN 37 (H) 01/27/2017   CREATININE 1.06 01/27/2017   PROT 6.8 01/27/2017   ALBUMIN 3.0 (L) 01/27/2017   BILITOT 0.7 01/27/2017   ALKPHOS 129 (H) 01/27/2017   AST 21 01/27/2017   ALT 13 (L) 01/27/2017  .   Total Time in preparing paper work, data evaluation and todays exam - 35 minutes  Zya Finkle M.D on 01/30/2017 at 10:05 AM    Note: This dictation was prepared with Dragon dictation along with smaller phrase technology. Any transcriptional errors that result from this process are unintentional.

## 2017-02-24 ENCOUNTER — Emergency Department
Admission: EM | Admit: 2017-02-24 | Discharge: 2017-02-24 | Disposition: A | Discharge status: Home / Self Care | Payer: Medicare Other | Attending: Emergency Medicine | Admitting: Emergency Medicine

## 2017-02-24 ENCOUNTER — Encounter: Payer: Self-pay | Admitting: *Deleted

## 2017-02-24 DIAGNOSIS — T402X1A Poisoning by other opioids, accidental (unintentional), initial encounter: Secondary | ICD-10-CM | POA: Diagnosis not present

## 2017-02-24 DIAGNOSIS — Z79899 Other long term (current) drug therapy: Secondary | ICD-10-CM | POA: Insufficient documentation

## 2017-02-24 DIAGNOSIS — T402X4A Poisoning by other opioids, undetermined, initial encounter: Secondary | ICD-10-CM | POA: Diagnosis not present

## 2017-02-24 DIAGNOSIS — R4189 Other symptoms and signs involving cognitive functions and awareness: Secondary | ICD-10-CM | POA: Diagnosis not present

## 2017-02-24 DIAGNOSIS — R0902 Hypoxemia: Secondary | ICD-10-CM | POA: Diagnosis not present

## 2017-02-24 DIAGNOSIS — Z7982 Long term (current) use of aspirin: Secondary | ICD-10-CM | POA: Insufficient documentation

## 2017-02-24 DIAGNOSIS — Z7401 Bed confinement status: Secondary | ICD-10-CM | POA: Diagnosis not present

## 2017-02-24 DIAGNOSIS — Z87891 Personal history of nicotine dependence: Secondary | ICD-10-CM | POA: Insufficient documentation

## 2017-02-24 DIAGNOSIS — I10 Essential (primary) hypertension: Secondary | ICD-10-CM | POA: Diagnosis not present

## 2017-02-24 DIAGNOSIS — E875 Hyperkalemia: Secondary | ICD-10-CM | POA: Insufficient documentation

## 2017-02-24 DIAGNOSIS — R6889 Other general symptoms and signs: Secondary | ICD-10-CM | POA: Diagnosis not present

## 2017-02-24 DIAGNOSIS — N289 Disorder of kidney and ureter, unspecified: Secondary | ICD-10-CM | POA: Diagnosis not present

## 2017-02-24 DIAGNOSIS — R4020 Unspecified coma: Secondary | ICD-10-CM | POA: Diagnosis not present

## 2017-02-24 DIAGNOSIS — R4182 Altered mental status, unspecified: Secondary | ICD-10-CM | POA: Diagnosis present

## 2017-02-24 LAB — BLOOD GAS, ARTERIAL
FIO2: 21
PATIENT TEMPERATURE: 37
PCO2 ART: 19 mmHg — AB (ref 32.0–48.0)
PH ART: 7.49 — AB (ref 7.350–7.450)
PO2 ART: 46 mmHg — AB (ref 83.0–108.0)

## 2017-02-24 LAB — COMPREHENSIVE METABOLIC PANEL
ALBUMIN: 2.8 g/dL — AB (ref 3.5–5.0)
ALT: 15 U/L — AB (ref 17–63)
AST: 47 U/L — AB (ref 15–41)
Alkaline Phosphatase: 109 U/L (ref 38–126)
Anion gap: 16 — ABNORMAL HIGH (ref 5–15)
BILIRUBIN TOTAL: 0.8 mg/dL (ref 0.3–1.2)
BUN: 61 mg/dL — AB (ref 6–20)
CHLORIDE: 117 mmol/L — AB (ref 101–111)
CO2: 18 mmol/L — AB (ref 22–32)
Calcium: 9.3 mg/dL (ref 8.9–10.3)
Creatinine, Ser: 2.48 mg/dL — ABNORMAL HIGH (ref 0.61–1.24)
GFR calc Af Amer: 25 mL/min — ABNORMAL LOW (ref >60)
GFR, EST NON AFRICAN AMERICAN: 22 mL/min — AB (ref >60)
GLUCOSE: 113 mg/dL — AB (ref 65–99)
POTASSIUM: 5.5 mmol/L — AB (ref 3.5–5.1)
Sodium: 151 mmol/L — ABNORMAL HIGH (ref 135–145)
Total Protein: 7.7 g/dL (ref 6.5–8.1)

## 2017-02-24 LAB — CBC
HEMATOCRIT: 45.1 % (ref 40.0–52.0)
Hemoglobin: 14.2 g/dL (ref 13.0–18.0)
MCH: 29.5 pg (ref 26.0–34.0)
MCHC: 31.5 g/dL — ABNORMAL LOW (ref 32.0–36.0)
MCV: 93.6 fL (ref 80.0–100.0)
Platelets: 342 10*3/uL (ref 150–440)
RBC: 4.81 MIL/uL (ref 4.40–5.90)
RDW: 16.5 % — ABNORMAL HIGH (ref 11.5–14.5)
WBC: 28.8 10*3/uL — AB (ref 3.8–10.6)

## 2017-02-24 LAB — TROPONIN I: Troponin I: <0.03 ng/mL (ref <0.03)

## 2017-02-24 MED ORDER — MORPHINE SULFATE (PF) 2 MG/ML IV SOLN
1.0000 mg | Freq: Once | INTRAVENOUS | Status: AC
  Administered 2017-02-24: 1 mg via INTRAVENOUS

## 2017-02-24 MED ORDER — MORPHINE SULFATE (PF) 2 MG/ML IV SOLN
INTRAVENOUS | Status: AC
  Filled 2017-02-24: qty 1

## 2017-02-24 MED ORDER — CEPHALEXIN 250 MG/5ML PO SUSR: 250.0000 mg | Freq: Two times a day (BID) | ORAL | 0 refills | Status: AC

## 2017-02-24 NOTE — ED Notes (Signed)
Critical ABG results given to Dr. Lenard LancePaduchowski

## 2017-02-24 NOTE — ED Triage Notes (Signed)
Pt arrives from [facility] for a ortho follow up, granddaughter states his fingers and toes were turning blue and he was unresponsive, upon arrival to ER EDP at bedside, pt responsive to painful stimuli, 0.4 mg Narcan ordered, pt recently had hip surgery and has been taking morphine po, states last dose of morphine was this AM at 1130, pt moaning and tachypenic after narcan given, hx of dementia, Una boots on both legs

## 2017-02-24 NOTE — ED Provider Notes (Addendum)
[facility] Emergency Department Provider Note  Time seen: 1:39 PM  I have reviewed the triage vital signs and the nursing notes.   HISTORY  Chief Complaint No chief complaint on file.    HPI Curtis Nunez is a 83 y.o. male with a past medical history of hypertension, hyperlipidemia, dementia, recent hip surgery, presents to the emergency department unresponsive. According to the patient's granddaughter patient has dementia, at baseline he remains in a wheelchair, mumbles but does not communicate. She states over the past several days he has been much more somnolent. She does note one week ago he was prescribed morphine to take at home for his significant hip pain to keep him comfortable. The patient was going to an orthopedic follow-up appointment today when he was noted to be very purple in appearance and unresponsive. Patient was rushed to the emergency department. Upon arrival the patient is unresponsive to painful stimuli, but is breathing on his own with a pulse.  Past Medical History:  Diagnosis Date  . BPH (benign prostatic hyperplasia)   . Dementia   . Gout   . HLD (hyperlipidemia)   . Hypertension     Patient Active Problem List   Diagnosis Date Noted  . Protein-calorie malnutrition (HCC)   . Palliative care by specialist   . Goals of care, counseling/discussion   . GI bleed 01/27/2017  . Pressure injury of skin 01/08/2017  . Right hip pain 01/08/2017  . Dislocation of right hip (HCC) 01/07/2017  . HTN (hypertension) 01/07/2017  . HLD (hyperlipidemia) 01/07/2017  . BPH (benign prostatic hyperplasia) 01/07/2017  . Gout 01/07/2017  . Dementia 01/07/2017  . Hip fracture (HCC) 12/16/2016    Past Surgical History:  Procedure Laterality Date  . HIP ARTHROPLASTY Right 12/16/2016   Procedure: ARTHROPLASTY BIPOLAR HIP (HEMIARTHROPLASTY);  Surgeon: Christena FlakePoggi, John J, MD;  Location: [facility] ORS;  Service: Orthopedics;  Laterality: Right;  . TOTAL HIP  REVISION Right 01/08/2017   Procedure: RIGHT HIP HEMIARTHROPLASTY ;  Surgeon: Melodye PedMacDonald, Scott R, DO;  Location: [facility] ORS;  Service: Orthopedics;  Laterality: Right;    Prior to Admission medications   Medication Sig Start Date End Date Taking? Authorizing Provider  acetaminophen (TYLENOL) 325 MG tablet Take 2 tablets (650 mg total) by mouth every 6 (six) hours as needed for mild pain (or Fever >/= 101). 01/11/17   Sainani, Rolly PancakeVivek J, MD  allopurinol (ZYLOPRIM) 100 MG tablet Take 100 mg by mouth daily.  11/16/16   [provider]  aspirin EC 81 MG tablet Take 81 mg by mouth daily.     [provider]  atenolol (TENORMIN) 25 MG tablet Take 25 mg by mouth daily.    [provider]  COCONUT OIL PO Take 1 tablet by mouth daily.    [provider]  feeding supplement, ENSURE ENLIVE, (ENSURE ENLIVE) LIQD Take 237 mLs by mouth 3 (three) times daily between meals. 01/30/17   Katha HammingKonidena, Snehalatha, MD  Omega-3 Fatty Acids (FISH OIL OMEGA-3 PO) Take 1 tablet by mouth daily.    [provider]  pantoprazole (PROTONIX) 40 MG tablet Take 1 tablet (40 mg total) by mouth daily. 01/30/17   Katha HammingKonidena, Snehalatha, MD  senna (SENOKOT) 8.6 MG TABS tablet Take 1 tablet by mouth 2 (two) times daily.    [provider]    No Known Allergies  Family History  Problem Relation Age of Onset  . Heart attack Mother   . Brain cancer Sister   .  Heart attack Brother   . Stroke Brother     Social History Social History  Substance Use Topics  . Smoking status: Former Smoker    Types: Cigarettes  . Smokeless tobacco: Former Neurosurgeon  . Alcohol use No    Review of Systems Unable to obtain an accurate review of systems due to significant dementia at baseline and unresponsiveness currently.  ____________________________________________   PHYSICAL EXAM:  Constitutional: Eyes are closed, unresponsive to painful stimuli but breathing on his own. Frail in appearance. Eyes:  Pinpoint right pupil, left eye remains closed likely chronic ENT   Head: Normocephalic and atraumatic.   Mouth/Throat: Dry mucous membranes Cardiovascular: Normal rate, regular rhythm. Respiratory: Normal respiratory rate, breathing on his own with  clear lung sounds bilaterally. No obvious wheezes rales or rhonchi Gastrointestinal: Soft and nontender. No distention.  Thin. Musculoskeletal: Frail extremities, appear atraumatic. Neurologic:  Patient is unresponsive to painful stimuli. Skin:  Skin is somewhat cool to the touch and mottled in appearance. Psychiatric: Unable to assess psychiatrically given unresponsive.  ____________________________________________    EKG  EKG reviewed and interpreted by myself shows normal sinus rhythm at 98 bpm, narrow QRS, left axis deviation, large the normal intervals with nonspecific ST changes. No ST elevation.  ____________________________________________  INITIAL IMPRESSION / ASSESSMENT AND PLAN / ED COURSE  Pertinent labs & imaging results that were available during my care of the patient were reviewed by me and considered in my medical decision making (see chart for details).  The patient presents to the emergency department after an episode of unresponsiveness. Patient is on morphine which appears to be a new medication over the past 7 days for the patient per granddaughter. Patient given 2 mg of Narcan, and became much more awake. Patient was moaning appeared to be in pain. Attempted to dose 1 mg of morphine after some time in the past. We are unable to obtain an adequate pulse ox on the patient. I had respiratory perform an ABG. ABG shows a PCO2 of 19 with a PO2 of 46. Patient also has a white blood cell count of 28 as well as renal insufficiency with hyperkalemia and a mild anion gap. I discussed the results with the family at length. I recommended/offered admission to the hospital for treatment as well as a sepsis workup including chest  x-ray and catheterization urine. Patient is already on hospice care 3 days a week. I discussed with the patient's wife who is the power of attorney as well as the rest of the family exactly what goals they have. They do not want the patient to be intubated if needed. They do not want the patient hospitalize and would rather he go home until about his remaining time whether that be several hours or several days. Given the patient's high white blood cell count and low oxygen level and discuss the possibility of sepsis/infection. They're asking for a liquid antibiotic although they do not want the chest x-ray or urine catheterization performed. I believe this is a reasonable plan as the patient is under hospice care. I called the patient's hospice provider [facility]. They have increased the patient to 5 days a week hospice care and state they will have a hospice provider check on the patient tonight. The family understands that this is likely an end-of-life illness, but they still wished to take the patient home and make him as comfortable as possible. We will arrange for EMS transportation home for the patient.    ____________________________________________  FINAL CLINICAL IMPRESSION(S) / ED DIAGNOSES  Unresponsiveness Opiate overdose Renal failure Hyperkalemia Hypoxia     Minna Antis, MD 02/24/17 1547    Minna Antis, MD 02/24/17 1547

## 2017-02-24 NOTE — ED Notes (Signed)
RT at bedside for spo2 monitoring, Dr. Lenard LancePaduchowski notified of cyanotic fingertips

## 2017-02-24 NOTE — ED Notes (Signed)
EMS at bedside for transport

## 2017-02-24 NOTE — ED Notes (Signed)
Family at bedside. 

## 2017-02-24 NOTE — ED Notes (Signed)
EMS transport arranged per Dr. Lenard LancePaduchowski

## 2017-03-12 DEATH — deceased

## 2017-06-26 IMAGING — CT CT HEAD W/O CM
3 of 4 series · 15 of 47 positions shown, 18 images · non-contrast
Comparison: 10/04/2007 CT

CLINICAL DATA: 89-year-old male with altered mental status.

EXAM:
CT HEAD WITHOUT CONTRAST
TECHNIQUE: Contiguous axial images were obtained from the base of the skull
through the vertex without intravenous contrast.

[Series 2: head wo · axial · 0.47mm/px · z∈[-157,-37]mm · 9 of 30 slices shown, 12 images]
[im 3/30  brain]
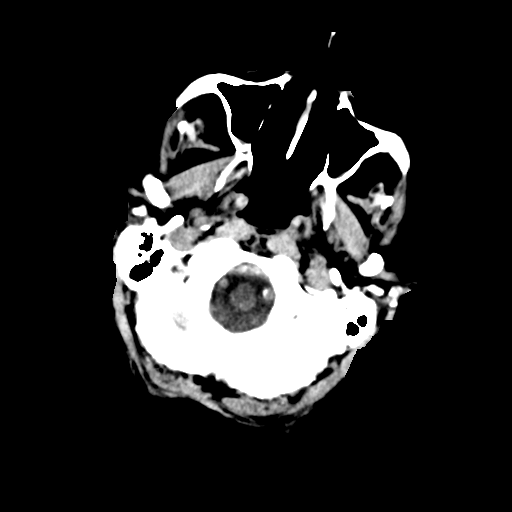
[im 3/30  bone]
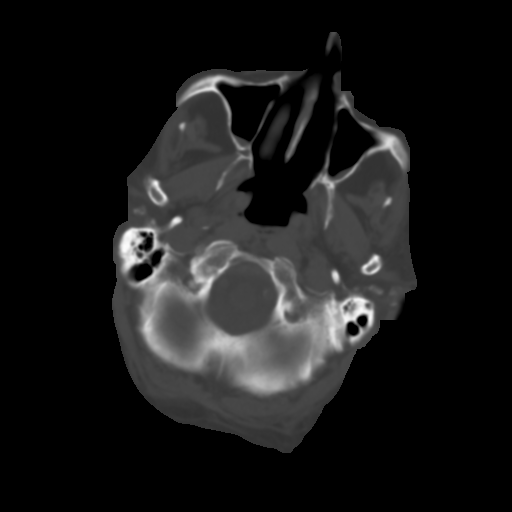
[im 7/30  brain]
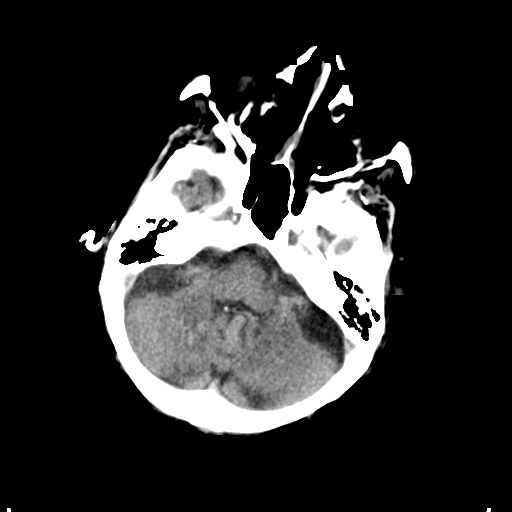
[im 9/30  brain]
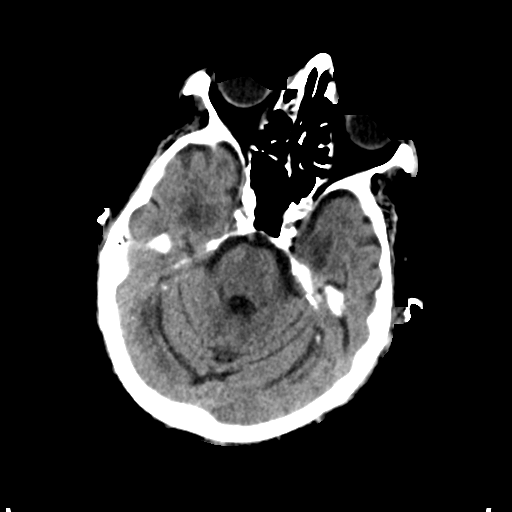
[im 13/30  brain]
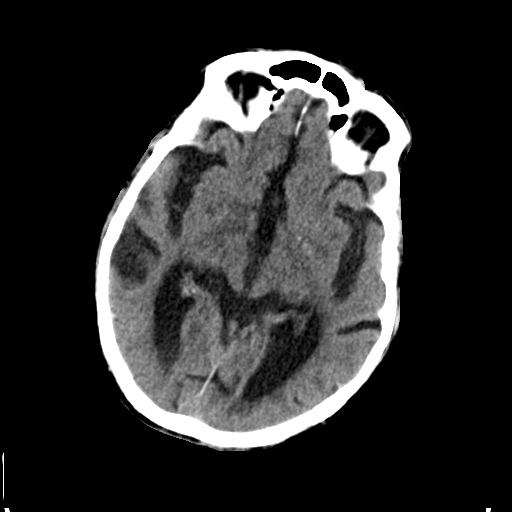
[im 15/30  brain]
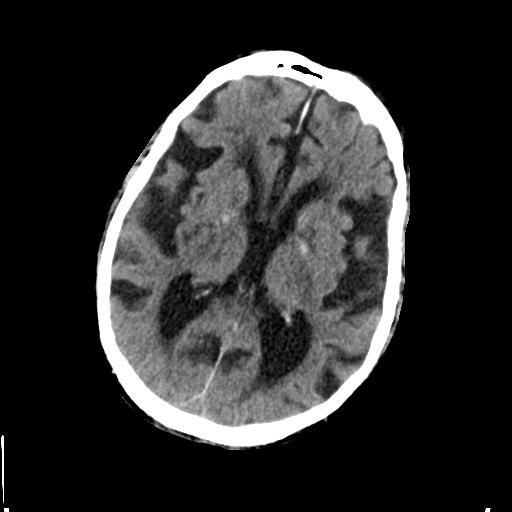
[im 15/30  bone]
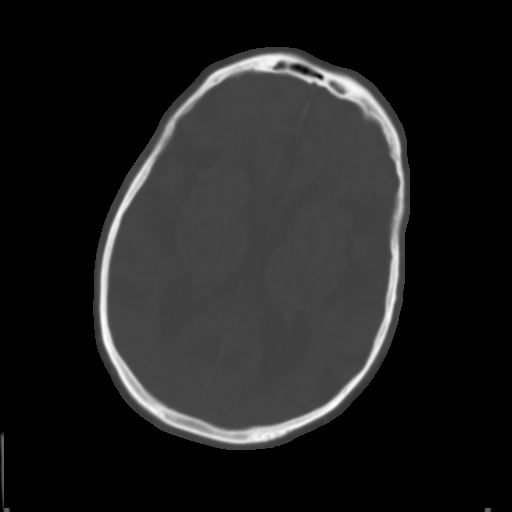
[im 17/30  brain]
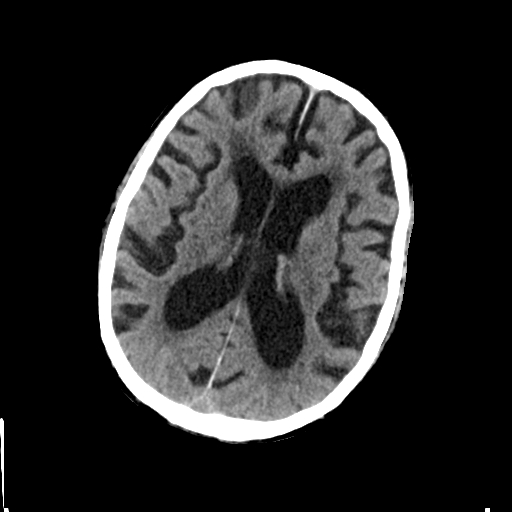
[im 21/30  brain]
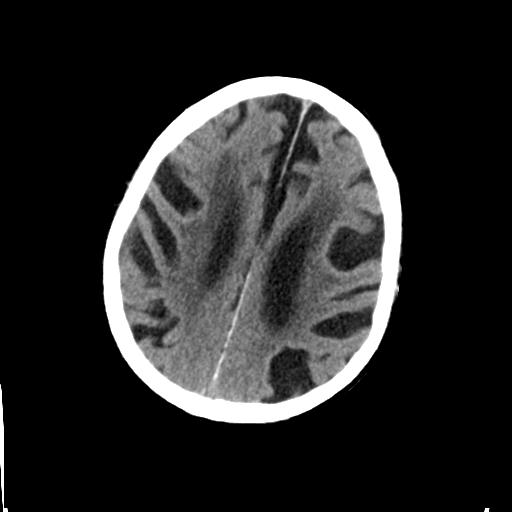
[im 23/30  brain]
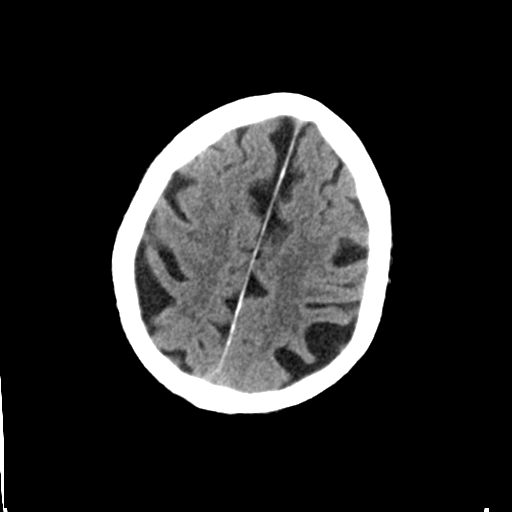
[im 27/30  brain]
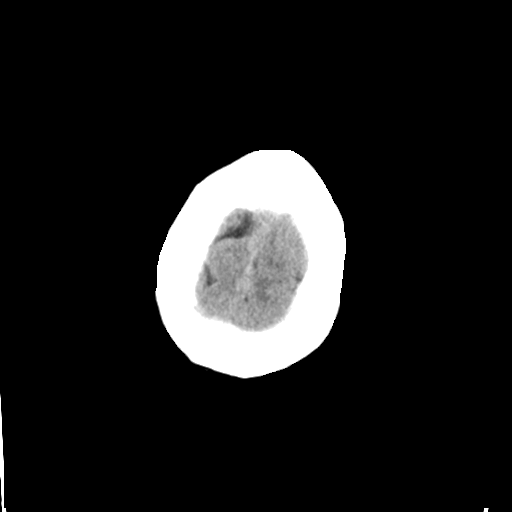
[im 27/30  bone]
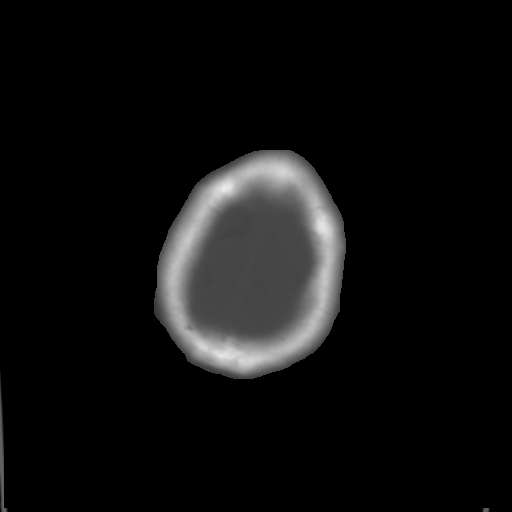

[Series 4: coronal soft tissue · coronal · 0.30mm/px · 3 of 68 slices shown]
[im 23/68  brain]
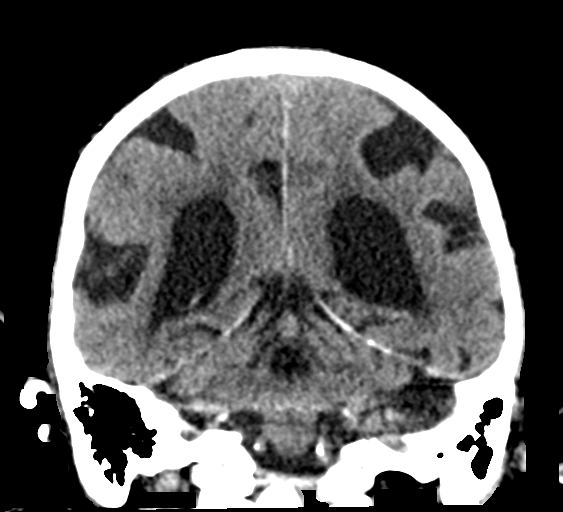
[im 30/68  brain]
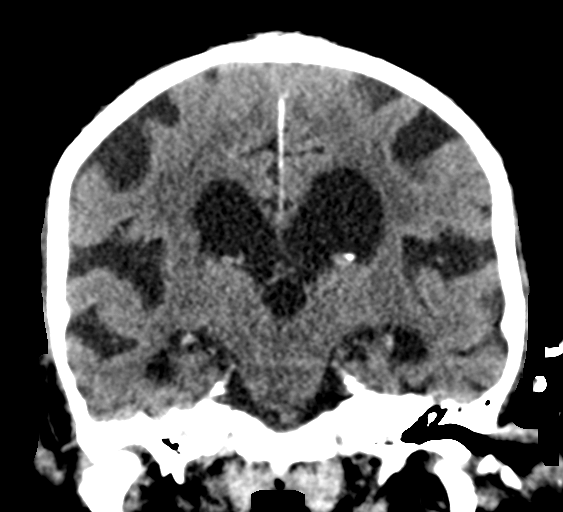
[im 38/68  brain]
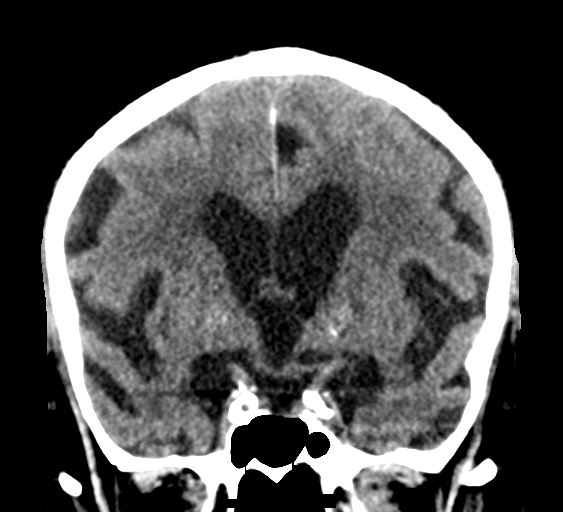

[Series 5: sagittal soft tissue · sagittal · 0.30mm/px · 3 of 56 slices shown]
[im 19/56  brain]
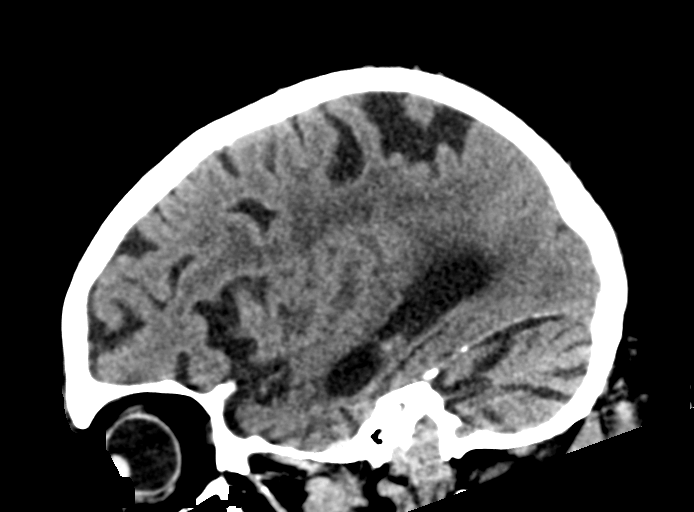
[im 28/56  brain]
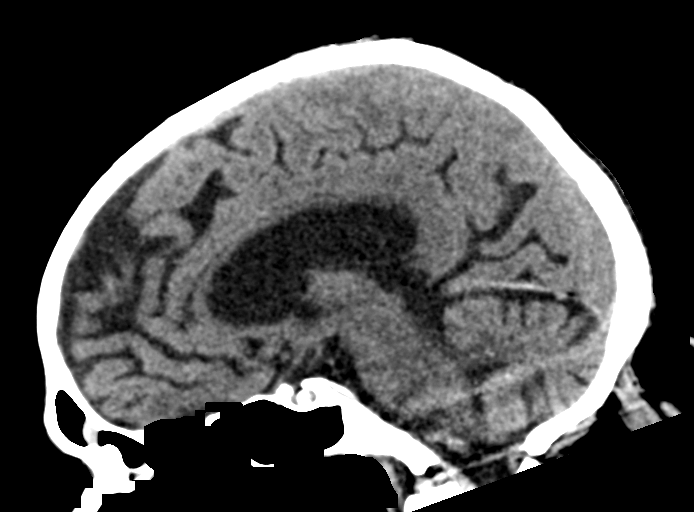
[im 37/56  brain]
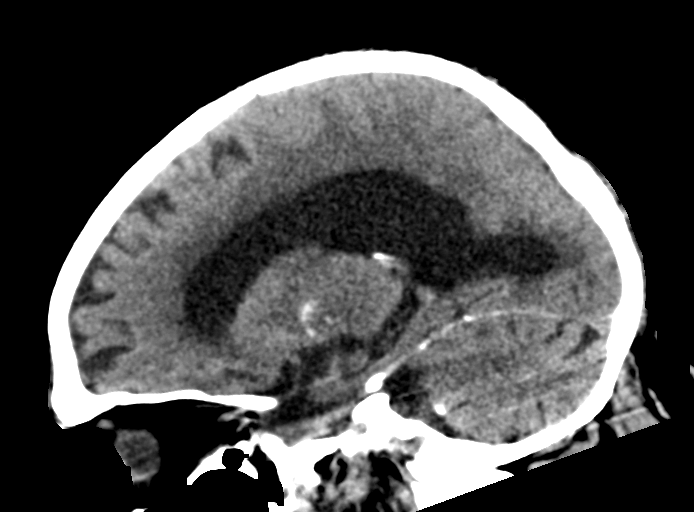

[15 of 47 positions shown; findings below may reference images not displayed]

FINDINGS: Brain: No evidence of acute infarction, hemorrhage, hydrocephalus,
extra-axial collection or mass lesion/mass effect.

Atrophy and chronic small-vessel white matter ischemic changes again
noted.

Vascular: Intracranial atherosclerotic calcifications identified.

Skull: Normal. Negative for fracture or focal lesion.

Sinuses/Orbits: No acute finding.

Other: None.
IMPRESSION: No evidence of acute intracranial abnormality.

Atrophy and chronic small-vessel white matter ischemic changes.
